# Patient Record
Sex: Female | Born: 1945 | Race: White | Hispanic: No | Marital: Married | Smoking: Never smoker
Health system: Southern US, Community
[De-identification: ages and names within clinical notes are randomized; demographics above are authoritative.]

## PROBLEM LIST (undated history)

## (undated) DIAGNOSIS — F419 Anxiety disorder, unspecified: Secondary | ICD-10-CM

## (undated) DIAGNOSIS — Z780 Asymptomatic menopausal state: Secondary | ICD-10-CM

## (undated) DIAGNOSIS — R002 Palpitations: Secondary | ICD-10-CM

## (undated) DIAGNOSIS — J438 Other emphysema: Secondary | ICD-10-CM

## (undated) DIAGNOSIS — K589 Irritable bowel syndrome without diarrhea: Secondary | ICD-10-CM

## (undated) DIAGNOSIS — E559 Vitamin D deficiency, unspecified: Secondary | ICD-10-CM

## (undated) DIAGNOSIS — Z1211 Encounter for screening for malignant neoplasm of colon: Secondary | ICD-10-CM

## (undated) DIAGNOSIS — I38 Endocarditis, valve unspecified: Secondary | ICD-10-CM

## (undated) DIAGNOSIS — K635 Polyp of colon: Secondary | ICD-10-CM

## (undated) DIAGNOSIS — Z8719 Personal history of other diseases of the digestive system: Secondary | ICD-10-CM

## (undated) DIAGNOSIS — E7800 Pure hypercholesterolemia, unspecified: Secondary | ICD-10-CM

## (undated) DIAGNOSIS — I059 Rheumatic mitral valve disease, unspecified: Secondary | ICD-10-CM

## (undated) DIAGNOSIS — C679 Malignant neoplasm of bladder, unspecified: Secondary | ICD-10-CM

## (undated) DIAGNOSIS — I499 Cardiac arrhythmia, unspecified: Secondary | ICD-10-CM

## (undated) DIAGNOSIS — R5381 Other malaise: Secondary | ICD-10-CM

## (undated) DIAGNOSIS — K219 Gastro-esophageal reflux disease without esophagitis: Secondary | ICD-10-CM

## (undated) DIAGNOSIS — R5383 Other fatigue: Secondary | ICD-10-CM

## (undated) DIAGNOSIS — R0789 Other chest pain: Secondary | ICD-10-CM

## (undated) DIAGNOSIS — M810 Age-related osteoporosis without current pathological fracture: Secondary | ICD-10-CM

## (undated) DIAGNOSIS — C801 Malignant (primary) neoplasm, unspecified: Secondary | ICD-10-CM

## (undated) DIAGNOSIS — I340 Nonrheumatic mitral (valve) insufficiency: Secondary | ICD-10-CM

## (undated) DIAGNOSIS — K5792 Diverticulitis of intestine, part unspecified, without perforation or abscess without bleeding: Secondary | ICD-10-CM

## (undated) DIAGNOSIS — N3000 Acute cystitis without hematuria: Secondary | ICD-10-CM

## (undated) DIAGNOSIS — I471 Supraventricular tachycardia: Secondary | ICD-10-CM

## (undated) HISTORY — DX: Palpitations: R00.2

## (undated) HISTORY — DX: Age-related osteoporosis without current pathological fracture: M81.0

## (undated) HISTORY — DX: Other emphysema: J43.8

## (undated) HISTORY — DX: Other malaise: R53.81

## (undated) HISTORY — DX: Endocarditis, valve unspecified: I38

## (undated) HISTORY — DX: Asymptomatic menopausal state: Z78.0

## (undated) HISTORY — DX: Gastro-esophageal reflux disease without esophagitis: K21.9

## (undated) HISTORY — DX: Other chest pain: R07.89

## (undated) HISTORY — PX: TONSILLECTOMY: SUR1361

## (undated) HISTORY — PX: ENDOMETRIAL BIOPSY: SHX622

## (undated) HISTORY — DX: Rheumatic mitral valve disease, unspecified: I05.9

## (undated) HISTORY — DX: Polyp of colon: K63.5

## (undated) HISTORY — PX: APPENDECTOMY: SHX54

## (undated) HISTORY — DX: Other fatigue: R53.83

## (undated) HISTORY — DX: Vitamin D deficiency, unspecified: E55.9

## (undated) HISTORY — PX: OTHER SURGICAL HISTORY: SHX169

## (undated) HISTORY — DX: Encounter for screening for malignant neoplasm of colon: Z12.11

## (undated) HISTORY — DX: Anxiety disorder, unspecified: F41.9

## (undated) HISTORY — DX: Diverticulitis of intestine, part unspecified, without perforation or abscess without bleeding: K57.92

## (undated) HISTORY — DX: Pure hypercholesterolemia, unspecified: E78.00

## (undated) HISTORY — DX: Supraventricular tachycardia: I47.1

## (undated) HISTORY — DX: Malignant neoplasm of bladder, unspecified: C67.9

## (undated) HISTORY — PX: CHOLECYSTECTOMY: SHX55

## (undated) HISTORY — DX: Irritable bowel syndrome, unspecified: K58.9

## (undated) HISTORY — DX: Acute cystitis without hematuria: N30.00

---

## 2014-04-19 ENCOUNTER — Emergency Department (HOSPITAL_COMMUNITY)
Admission: EM | Admit: 2014-04-19 | Discharge: 2014-04-20 | Disposition: A | Discharge status: Home / Self Care | Payer: Medicare HMO | Attending: Emergency Medicine | Admitting: Emergency Medicine

## 2014-04-19 ENCOUNTER — Encounter (HOSPITAL_COMMUNITY): Payer: Self-pay | Admitting: *Deleted

## 2014-04-19 ENCOUNTER — Emergency Department (HOSPITAL_COMMUNITY): Payer: Medicare HMO

## 2014-04-19 DIAGNOSIS — Z79899 Other long term (current) drug therapy: Secondary | ICD-10-CM | POA: Insufficient documentation

## 2014-04-19 DIAGNOSIS — H4710 Unspecified papilledema: Secondary | ICD-10-CM | POA: Diagnosis not present

## 2014-04-19 DIAGNOSIS — G935 Compression of brain: Secondary | ICD-10-CM | POA: Diagnosis not present

## 2014-04-19 DIAGNOSIS — R519 Headache, unspecified: Secondary | ICD-10-CM

## 2014-04-19 DIAGNOSIS — Z87891 Personal history of nicotine dependence: Secondary | ICD-10-CM | POA: Insufficient documentation

## 2014-04-19 DIAGNOSIS — R110 Nausea: Secondary | ICD-10-CM | POA: Insufficient documentation

## 2014-04-19 DIAGNOSIS — R51 Headache: Secondary | ICD-10-CM | POA: Diagnosis present

## 2014-04-19 MED ORDER — ACETAMINOPHEN 325 MG PO TABS: 650.0000 mg | ORAL_TABLET | Freq: Once | ORAL | Status: DC

## 2014-04-19 MED ORDER — FENTANYL CITRATE 0.05 MG/ML IJ SOLN: 100.0000 ug | Freq: Once | INTRAMUSCULAR | Status: DC

## 2014-04-19 NOTE — ED Notes (Signed)
Dr. Mesner at bedside   

## 2014-04-19 NOTE — ED Provider Notes (Signed)
CSN: [id number]     Arrival date & time 04/19/14  1836 History   First MD Initiated Contact with Patient 04/19/14 1913     Chief Complaint  Patient presents with  . Headache  . Eye Problem     (Consider location/radiation/quality/duration/timing/severity/associated sxs/prior Treatment) HPI  68 year old female with a known Chiari I malformation presents to the emergency department from optometry office secondary to papilledema. Patient states that she's had 2 months or so of bilateral frontal headaches worse in the morning slowly improving throughout the day also associated with some nausea. No significant vision changes during this time, no fevers, diarrhea, abdominal pain or other symptoms. She has no neurologic changes such as numbness or weakness anywhere.  History reviewed. No pertinent past medical history. History reviewed. No pertinent past surgical history. History reviewed. No pertinent family history. History  Substance Use Topics  . Smoking status: Former Research scientist (life sciences)  . Smokeless tobacco: Not on file  . Alcohol Use: No   OB History    No data available     Review of Systems  Constitutional: Negative for fever and chills.  HENT: Negative for congestion and drooling.   Respiratory: Negative for cough and shortness of breath.   Cardiovascular: Negative for chest pain.  Gastrointestinal: Positive for nausea.  Musculoskeletal: Negative for arthralgias.  Neurological: Positive for headaches.  All other systems reviewed and are negative.     Allergies  Review of patient's allergies indicates no known allergies.  Home Medications   Prior to Admission medications   Medication Sig Start Date End Date Taking? Authorizing Provider  ALPRAZolam Duanne Moron) 0.5 MG tablet Take 0.5 mg by mouth at bedtime as needed for anxiety.   Yes Historical Provider, MD  omega-3 acid ethyl esters (LOVAZA) 1 G capsule Take 1 g by mouth daily.   Yes Historical Provider, MD  pantoprazole (PROTONIX)  40 MG tablet Take 40 mg by mouth daily.   Yes Historical Provider, MD   BP 128/67 mmHg  Pulse 64  Temp(Src) 98.3 F (36.8 C) (Oral)  Resp 15  SpO2 94% Physical Exam  Constitutional: She is oriented to person, place, and time. She appears well-developed and well-nourished.  HENT:  Head: Normocephalic and atraumatic.  Eyes: Conjunctivae and EOM are normal. Right eye exhibits no chemosis and no discharge. Left eye exhibits no chemosis and no discharge. Right conjunctiva is not injected. Right conjunctiva has no hemorrhage. Left conjunctiva is not injected. Left conjunctiva has no hemorrhage.  Fundoscopic exam:      The right eye shows hemorrhage and papilledema.       The left eye shows no hemorrhage and no papilledema.  Cardiovascular: Normal rate and regular rhythm.   Pulmonary/Chest: Effort normal and breath sounds normal. No respiratory distress.  Abdominal: Soft. She exhibits no distension. There is no tenderness. There is no rebound.  Musculoskeletal: Normal range of motion. She exhibits no edema or tenderness.  Neurological: She is alert and oriented to person, place, and time.  Skin: Skin is warm and dry.  Nursing note and vitals reviewed.   ED Course  Procedures (including critical care time) Labs Review Labs Reviewed - No data to display  Imaging Review Mr Brain Wo Contrast  04/19/2014   CLINICAL DATA:  Daily headaches for 2 months, RIGHT eye hemorrhage noted at doctor's appointment today, no vision change.  EXAM: MRI HEAD AND ORBITS WITHOUT CONTRAST  TECHNIQUE: Multiplanar, multiecho pulse sequences of the brain and surrounding structures were obtained without intravenous contrast. Multiplanar, multiecho  pulse sequences of the orbits and surrounding structures were obtained including fat saturation techniques, without intravenous contrast administration.  COMPARISON:  None available for comparison at time of study interpretation.  FINDINGS: MRI HEAD FINDINGS  No reduced  diffusion to suggest acute ischemia. No susceptibility artifact to suggest hemorrhage. The ventricles and sulci are normal for patient's age. A few subcentimeter supratentorial white matter FLAIR T2 hyperintensities are less than expected for age, though nonspecific can be seen with chronic small vessel ischemic disease. No midline shift or mass effect.  No abnormal extra-axial fluid collections. Normal major intracranial vascular flow voids seen at the skull base. Cerebellar tonsils descend 12 mm below the foramen magnum with pointed appearance and effaced cerebral spinal fluid space at the craniocervical junction.  Paranasal sinuses and mastoid air cells appear well-aerated. No abnormal sellar expansion. Pannus about the odontoid process can be seen with CPPD. No suspicious calvarial bone marrow signal.  MRI ORBITS FINDINGS  Bilateral ocular globes are well formed, ocular lenses are located. Normal signal characteristics of the ocular globes without fluid fluid level. Normal appearance of the optic nerve sheath complexes. Preservation of the orbital fat. Normal appearance of the extraocular muscles. Superior ophthalmic veins are not enlarged. Minimal susceptibility artifact along the anterior chamber the eye most consistent with makeup artifact.  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  Mild white matter changes suggest chronic small vessel ischemic disease.  MRI ORBITS:  Normal noncontrast MRI of the orbits.   Electronically Signed   By: Elon Alas   On: 04/19/2014 22:56   Mr Darnelle Catalan Wo Cm  04/19/2014   CLINICAL DATA:  Daily headaches for 2 months, RIGHT eye hemorrhage noted at doctor's appointment today, no vision change.  EXAM: MRI HEAD AND ORBITS WITHOUT CONTRAST  TECHNIQUE: Multiplanar, multiecho pulse sequences of the brain and  surrounding structures were obtained without intravenous contrast. Multiplanar, multiecho pulse sequences of the orbits and surrounding structures were obtained including fat saturation techniques, without intravenous contrast administration.  COMPARISON:  None available for comparison at time of study interpretation.  FINDINGS: MRI HEAD FINDINGS  No reduced diffusion to suggest acute ischemia. No susceptibility artifact to suggest hemorrhage. The ventricles and sulci are normal for patient's age. A few subcentimeter supratentorial white matter FLAIR T2 hyperintensities are less than expected for age, though nonspecific can be seen with chronic small vessel ischemic disease. No midline shift or mass effect.  No abnormal extra-axial fluid collections. Normal major intracranial vascular flow voids seen at the skull base. Cerebellar tonsils descend 12 mm below the foramen magnum with pointed appearance and effaced cerebral spinal fluid space at the craniocervical junction.  Paranasal sinuses and mastoid air cells appear well-aerated. No abnormal sellar expansion. Pannus about the odontoid process can be seen with CPPD. No suspicious calvarial bone marrow signal.  MRI ORBITS FINDINGS  Bilateral ocular globes are well formed, ocular lenses are located. Normal signal characteristics of the ocular globes without fluid fluid level. Normal appearance of the optic nerve sheath complexes. Preservation of the orbital fat. Normal appearance of the extraocular muscles. Superior ophthalmic veins are not enlarged. Minimal susceptibility artifact along the anterior chamber the eye most consistent with makeup artifact.  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  Mild white matter changes suggest  chronic small vessel ischemic disease.  MRI ORBITS:  Normal noncontrast MRI of  the orbits.   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 No other significant findings on history of physical. We'll get MRI of head and orbits to evaluate for emergent causes of the papilledema. If this is negative patient will likely be discharged to follow-up with ophthalmology.  MRI negative. Spoke with ophtho who agreed she could follow up tomorrow. Patient wanting to follow up with optho closer to her home, info for our opthalmologist given in case that is her wish.   Merrily Pew, MD 04/19/14 [id number]  Pamella Pert, MD 04/20/14 1226

## 2014-04-19 NOTE — ED Notes (Signed)
Pt reports having headaches every morning for past two months. Went for an eye exam today and was sent here due to hemorrhages noted to right eye on exam and swelling. Denies any changes in vision.

## 2014-04-20 NOTE — ED Notes (Signed)
Pt. Left with all belongings and refused wheelchair 

## 2015-06-29 DIAGNOSIS — K219 Gastro-esophageal reflux disease without esophagitis: Secondary | ICD-10-CM

## 2015-06-29 DIAGNOSIS — I059 Rheumatic mitral valve disease, unspecified: Secondary | ICD-10-CM | POA: Insufficient documentation

## 2015-06-29 DIAGNOSIS — M810 Age-related osteoporosis without current pathological fracture: Secondary | ICD-10-CM | POA: Insufficient documentation

## 2015-06-29 DIAGNOSIS — F419 Anxiety disorder, unspecified: Secondary | ICD-10-CM

## 2015-06-29 DIAGNOSIS — Z780 Asymptomatic menopausal state: Secondary | ICD-10-CM | POA: Insufficient documentation

## 2015-06-29 DIAGNOSIS — E559 Vitamin D deficiency, unspecified: Secondary | ICD-10-CM | POA: Insufficient documentation

## 2015-06-29 DIAGNOSIS — E7800 Pure hypercholesterolemia, unspecified: Secondary | ICD-10-CM

## 2015-06-29 HISTORY — DX: Vitamin D deficiency, unspecified: E55.9

## 2015-06-29 HISTORY — DX: Gastro-esophageal reflux disease without esophagitis: K21.9

## 2015-06-29 HISTORY — DX: Asymptomatic menopausal state: Z78.0

## 2015-06-29 HISTORY — DX: Rheumatic mitral valve disease, unspecified: I05.9

## 2015-06-29 HISTORY — DX: Age-related osteoporosis without current pathological fracture: M81.0

## 2015-06-29 HISTORY — DX: Pure hypercholesterolemia, unspecified: E78.00

## 2015-06-29 HISTORY — DX: Anxiety disorder, unspecified: F41.9

## 2015-07-02 DIAGNOSIS — R5383 Other fatigue: Secondary | ICD-10-CM

## 2015-07-02 DIAGNOSIS — R5381 Other malaise: Secondary | ICD-10-CM

## 2015-07-02 HISTORY — DX: Other malaise: R53.81

## 2015-07-02 HISTORY — DX: Other fatigue: R53.83

## 2016-01-03 DIAGNOSIS — Z1211 Encounter for screening for malignant neoplasm of colon: Secondary | ICD-10-CM

## 2016-01-03 HISTORY — DX: Encounter for screening for malignant neoplasm of colon: Z12.11

## 2016-07-03 DIAGNOSIS — J438 Other emphysema: Secondary | ICD-10-CM | POA: Insufficient documentation

## 2016-07-03 DIAGNOSIS — N3000 Acute cystitis without hematuria: Secondary | ICD-10-CM | POA: Insufficient documentation

## 2016-07-03 HISTORY — DX: Other emphysema: J43.8

## 2016-07-03 HISTORY — DX: Acute cystitis without hematuria: N30.00

## 2016-10-29 DIAGNOSIS — I471 Supraventricular tachycardia: Secondary | ICD-10-CM

## 2016-10-29 DIAGNOSIS — I4719 Other supraventricular tachycardia: Secondary | ICD-10-CM

## 2016-10-29 DIAGNOSIS — R0789 Other chest pain: Secondary | ICD-10-CM | POA: Insufficient documentation

## 2016-10-29 DIAGNOSIS — R002 Palpitations: Secondary | ICD-10-CM

## 2016-10-29 HISTORY — DX: Supraventricular tachycardia: I47.1

## 2016-10-29 HISTORY — DX: Palpitations: R00.2

## 2016-10-29 HISTORY — DX: Other supraventricular tachycardia: I47.19

## 2016-10-29 HISTORY — DX: Other chest pain: R07.89

## 2017-02-23 ENCOUNTER — Encounter: Payer: Self-pay | Admitting: *Deleted

## 2017-03-16 DIAGNOSIS — R079 Chest pain, unspecified: Secondary | ICD-10-CM | POA: Insufficient documentation

## 2017-03-16 NOTE — Progress Notes (Signed)
Cardiology Office Note:    Date:  03/18/2017   ID:  Kara Reynolds, DOB [birth year], MRN [id number]  PCP:  Raina Mina., MD  Cardiologist:  Shirlee More, MD     ASSESSMENT:    1. Premature ventricular contractions (PVCs) (VPCs)   2. Paroxysmal atrial tachycardia (HCC)    PLAN:    In order of problems listed above:  1. Noted on her stress echo, she is minimally symptomatic I offered her reassurance she will avoid over-the-counter proarrhythmic and alarmed her with a prescription for short acting low-dose beta-blocker that she can employ as needed.  I reassured her that her echocardiogram is normal she has no significant valvular regurgitation and her stress test showed no evidence of ischemia.  I do not think she requires an event monitor or loop recorder. 2. Stable   Next appointment: 1 year   Medication Adjustments/Labs and Tests Ordered: Current medicines are reviewed at length with the patient today.  Concerns regarding medicines are outlined above.  No orders of the defined types were placed in this encounter.  Meds ordered this encounter  Medications  . metoprolol tartrate (LOPRESSOR) 25 MG tablet    Sig: Take 2 tablets (50 mg total) daily as needed by mouth.    Dispense:  30 tablet    Refill:  1    Chief Complaint  Patient presents with  . Follow-up    What did my echo and stress test at [facility] show?  . Palpitations    History of Present Illness:    Kara Reynolds is a 71 y.o. female with a hx of PAT and palpitation  last seen by me more than two years ago but recently seen at [facility] in June 2018.Kara KitchenShe had a TTE and stress echo performed 11/06/16 and wants to understand the results. Compliance with diet, lifestyle and medications: Yes She was seen in my previous practice in June underwent an echocardiogram and stress echo and is unsettled thinking she has valvular heart disease.  She notices her pulse is irregular at times but is not having palpitation no sustained  rapid heart rhythm no chest pain shortness of breath syncope or TIA.  She uses no over-the-counter proarrhythmic medications. Past Medical History:  Diagnosis Date  . Acute cystitis without hematuria 07/03/2016  . Anxiety 06/29/2015   Last Assessment & Plan:  Needs refill on this does not use it daily at all , but it does help her and she would like to continue with this and never fills this consistently   . Chest discomfort 10/29/2016  . Encounter for screening colonoscopy 01/03/2016  . GERD (gastroesophageal reflux disease) 06/29/2015   Last Assessment & Plan:  We discussed the side effects of the PPIs and use of zantac alternating if she would like, and she feels the PPI is needed for her GERD and does helps her, discussed bone loss as well as memory issues with the class as well  . Hypercholesteremia 06/29/2015   Last Assessment & Plan:  Update her fasting labs for her today and discussed diet which she and her husband are working on more with her weight as well  . Malaise and fatigue 07/02/2015   Last Assessment & Plan:  Off and on for her, she tries to remain active is retired now worked at [employer] and has had blood exposure and she is going to have the hepatitis C panel drawn for this purpose  . 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 Osteoporosis 06/29/2015   Overview:  Recommend meds. She isnt willing.  Ordering a new dexa.  Last Assessment & Plan:  Update her vitamin d level, she is not on meds for this and had her last exam this year in [place]  . Other emphysema ([place]) 07/03/2016  . Palpitations 10/29/2016  . Paroxysmal atrial tachycardia ([place]) 10/29/2016  . Postmenopausal 06/29/2015   Last Assessment & Plan:  Not using any hormones, she feels she is doing well and has her mammograms done at [place]  . Vitamin D deficiency 06/29/2015   Last Assessment & Plan:  Update her level for her today and see where she is at    Past  Surgical History:  Procedure Laterality Date  . CHOLECYSTECTOMY      Current Medications: Current Meds  Medication Sig  . ALPRAZolam (XANAX) 0.5 MG tablet Take 0.5 mg by mouth at bedtime as needed for anxiety.  Kara Reynolds aspirin EC 81 MG tablet Take 81 mg by mouth.  . pantoprazole (PROTONIX) 40 MG tablet Take 40 mg by mouth daily.     Allergies:   Amoxicillin-pot clavulanate   Social History   Socioeconomic History  . Marital status: Married    Spouse name: None  . Number of children: None  . Years of education: None  . Highest education level: None  Social Needs  . Financial resource strain: None  . Food insecurity - worry: None  . Food insecurity - inability: None  . Transportation needs - medical: None  . Transportation needs - non-medical: None  Occupational History  . None  Tobacco Use  . Smoking status: Never Smoker  . Smokeless tobacco: Never Used  Substance and Sexual Activity  . Alcohol use: No  . Drug use: No  . Sexual activity: None  Other Topics Concern  . None  Social History Narrative  . None     Family History: The patient's family history includes Prostate cancer in her father; Stroke in her maternal grandfather. ROS:   Please see the history of present illness.    All other systems reviewed and are negative.  EKGs/Labs/Other Studies Reviewed:   EKG was done 12/04/2016 The following studies were reviewed today: Recent echo and stress echo were normal, she had physiologic Recent Labs: No results found for requested labs within last 8760 hours.  Recent Lipid Panel No results found for: CHOL, TRIG, HDL, CHOLHDL, VLDL, LDLCALC, LDLDIRECT  Physical Exam:    VS:  BP 120/90 (BP Location: Right Arm, Patient Position: Sitting, Cuff Size: Normal)   Pulse 67   Ht 5' 5.5" (1.664 m)   Wt 156 lb (70.8 kg)   SpO2 98%   BMI 25.56 kg/m     Wt Readings from Last 3 Encounters:  03/18/17 156 lb (70.8 kg)     GEN:  Well nourished, well developed in no acute  distress HEENT: Normal NECK: No JVD; No carotid bruits LYMPHATICS: No lymphadenopathy CARDIAC: RRR, no murmurs, rubs, gallops RESPIRATORY:  Clear to auscultation without rales, wheezing or rhonchi  ABDOMEN: Soft, non-tender, non-distended MUSCULOSKELETAL:  No edema; No deformity  SKIN: Warm and dry NEUROLOGIC:  Alert and oriented x 3 PSYCHIATRIC:  Normal affect    Signed, Shirlee More, MD  03/18/2017 11:53 AM    [place]

## 2017-03-18 ENCOUNTER — Encounter: Payer: Self-pay | Admitting: Cardiology

## 2017-03-18 ENCOUNTER — Ambulatory Visit: Payer: Medicare HMO | Admitting: Cardiology

## 2017-03-18 VITALS — BP 120/90 | HR 67 | Ht 65.5 in | Wt 156.0 lb

## 2017-03-18 DIAGNOSIS — I471 Supraventricular tachycardia: Secondary | ICD-10-CM

## 2017-03-18 DIAGNOSIS — I493 Ventricular premature depolarization: Secondary | ICD-10-CM | POA: Diagnosis not present

## 2017-03-18 MED ORDER — METOPROLOL TARTRATE 25 MG PO TABS: 50.0000 mg | ORAL_TABLET | Freq: Every day | ORAL | 1 refills | Status: AC | PRN

## 2017-03-18 NOTE — Patient Instructions (Addendum)
Medication Instructions:  Your physician has recommended you make the following change in your medication:  START metoprolol (Lopressor) 25 mg daily as needed for palpitations.  Labwork: None  Testing/Procedures: None  Follow-Up: Your physician wants you to follow-up in: 1 year. You will receive a reminder letter in the mail two months in advance. If you don't receive a letter, please call our office to schedule the follow-up appointment.  Any Other Special Instructions Will Be Listed Below (If Applicable).     If you need a refill on your cardiac medications before your next appointment, please call your pharmacy.    1. Avoid all over-the-counter antihistamines except Claritin/Loratadine and Zyrtec/Cetrizine. 2. Avoid all combination including cold sinus allergies flu decongestant and sleep medications 3. You can use Robitussin DM Mucinex and Mucinex DM for cough. 4. can use Tylenol aspirin ibuprofen and naproxen but no combinations such as sleep or sinus.

## 2018-01-13 ENCOUNTER — Ambulatory Visit (INDEPENDENT_AMBULATORY_CARE_PROVIDER_SITE_OTHER): Payer: Medicare HMO | Admitting: Gastroenterology

## 2018-01-13 ENCOUNTER — Encounter: Payer: Self-pay | Admitting: Gastroenterology

## 2018-01-13 ENCOUNTER — Other Ambulatory Visit (INDEPENDENT_AMBULATORY_CARE_PROVIDER_SITE_OTHER): Payer: Medicare HMO

## 2018-01-13 VITALS — BP 128/70 | HR 70 | Ht 65.0 in | Wt 154.1 lb

## 2018-01-13 DIAGNOSIS — R1013 Epigastric pain: Secondary | ICD-10-CM | POA: Diagnosis not present

## 2018-01-13 DIAGNOSIS — R112 Nausea with vomiting, unspecified: Secondary | ICD-10-CM

## 2018-01-13 MED ORDER — AMBULATORY NON FORMULARY MEDICATION: 1 refills | Status: DC

## 2018-01-13 NOTE — Patient Instructions (Signed)
If you are age 71 or older, your body mass index should be between 23-30. Your Body mass index is 25.65 kg/m. If this is out of the aforementioned range listed, please consider follow up with your Primary Care Provider.  If you are age 14 or younger, your body mass index should be between 19-25. Your Body mass index is 25.65 kg/m. If this is out of the aformentioned range listed, please consider follow up with your Primary Care Provider.   You have been scheduled for an Upper GI Series and Small Bowel Follow Thru at [facility]. Your appointment is on 01/21/18 at 10am. Please arrive 15 minutes prior to your test for registration. Make certain not to have anything to eat or drink after midnight on the night before your test. If you need to reschedule, please contact radiology at [phone]. --------------------------------------------------------------------------------------------------------------- An upper GI series uses x rays to help diagnose problems of the upper GI tract, which includes the esophagus, stomach, and duodenum. The duodenum is the first part of the small intestine. An upper GI series is conducted by a radiology technologist or a radiologist-a doctor who specializes in x-ray imaging-at a hospital or outpatient center. While sitting or standing in front of an x-ray machine, the patient drinks barium liquid, which is often white and has a chalky consistency and taste. The barium liquid coats the lining of the upper GI tract and makes signs of disease show up more clearly on x rays. X-ray video, called fluoroscopy, is used to view the barium liquid moving through the esophagus, stomach, and duodenum. Additional x rays and fluoroscopy are performed while the patient lies on an x-ray table. To fully coat the upper GI tract with barium liquid, the technologist or radiologist may press on the abdomen or ask the patient to change position. Patients hold still in various positions, allowing the  technologist or radiologist to take x rays of the upper GI tract at different angles. If a technologist conducts the upper GI series, a radiologist will later examine the images to look for problems.  This test typically takes about 1 hour to complete --------------------------------------------------------------------------------------------------------------------------------------------- The Small Bowel Follow Thru examination is used to visualize the entire small bowel (intestines); specifically the connection between the small and large intestine. You will be positioned on a flat x-ray table and an image of your abdomen taken. Then the technologist will show the x-ray to the radiologist. The radiologist will instruct your technologist how much (1-2 cups) barium sulfate you will drink and when to begin taking the timed x-rays, usually 15-30 minutes after you begin drinking. Barium is a harmless substance that will highlight your small intestine by absorbing x-ray. The taste is chalky and it feels very heavy both in the cup and in your stomach.  After the first x-ray is taken and shown to the radiologist, he/she will determine when the next image is to be taken. This is repeated until the barium has reached the end of the small intestine and enters the beginning of the colon (cecum). At such time when the barium spills into the colon, you will be positioned on the x-ray table once again. The radiologist will use a fluoroscopic camera to take some detailed pictures of the connection between your small intestine and colon. The fluoroscope is an x-ray unit that works with a television/computer screen. The radiologist will apply pressure to your abdomen with his/her hand and a lead glove, a plastic paddle, or a paddle with an inflated rubber balloon  on the end. This is to spread apart your loops of intestine so he/she can see all areas.   This test typically takes around 1 hour to complete.  Important  Drink  plenty of water (8-10 cups/day) for a few days following the procedure to avoid constipation and blockage. The barium will make your stools white for a few days. --------------------------------------------------------------------------------------------------------------------------------------------  You have been scheduled for an abdominal ultrasound at [facility] (1st floor of hospital) on 01/21/18 at 10am. Please arrive 15 minutes prior to your appointment for registration. Make certain not to have anything to eat or drink 6 hours prior to your appointment. Should you need to reschedule your appointment, please contact radiology at [phone]. This test typically takes about 30 minutes to perform.  Please go to the lab on the 2nd floor [address] before you leave the office today.   We have sent the following medications to your pharmacy for you to pick up at your convenience: GI cocktail   You have been scheduled for an endoscopy. Please follow written instructions given to you at your visit today. If you use inhalers (even only as needed), please bring them with you on the day of your procedure. Your physician has requested that you go to www.startemmi.com and enter the access code given to you at your visit today. This web site gives a general overview about your procedure. However, you should still follow specific instructions given to you by our office regarding your preparation for the procedure.  Thank you,  Dr. Jackquline Denmark

## 2018-01-13 NOTE — Progress Notes (Signed)
Chief Complaint: Epigastric pain  Referring Provider:  Raina Mina., MD      ASSESSMENT AND PLAN;   #1.  Epigastric pain with episodic N/V, could have associated esophageal spasms. Pt has complicated biliary history.  Status post lap chole 11/2011 with cystic stump leak status post ERCP with biliary sphincterotomy and stent insertion 12/06/2011.  #2. GERD with small HH  Plan: - Continue protonix 40mg  po qd - Check CBC, CMP and lipase. - UGI series followed by EGD - Proceed with ultrasound of the abdomen complete. - Proceed with CT scan of the abdomen and pelvis if the above work-up is negative and continued problems. - GI cocktail 5 to 10 mL every 8 hours as needed #120 ml (equal amounts of Maalox, Donnatal and viscous lidocaine)    HPI:    Kara Reynolds is a 72 y.o. female Seth Bake' Sharon's mom (NP with Dr Georga Bora) Epigastric pain - like spasms "sweeps thru me" Xanax does help With associated nausea/vomiting Has been to the emergency room-noncardiac pains. Told to follow-up in the GI clinic. Describes pains as episodic, nonradiating, could not identify any triggering factors, almost feels like anxiety attacks, without jaundice dark urine or pale stools. No melena or hematochezia. No weight loss No fever or chills  Past GI procedures: -EGD 03/15/2014: Small hiatal hernia, mild gastritis, negative small bowel biopsies for celiac, negative biopsies for HP. -History of IBS with alternating diarrhea and constipation, last colonoscopy 07/09/2016 showing pancolonic diverticulosis predominantly in the sigmoid colon.    Past Medical History:  Diagnosis Date  . Acute cystitis without hematuria 07/03/2016  . Anxiety 06/29/2015   Last Assessment & Plan:  Needs refill on this does not use it daily at all , but it does help her and she would like to continue with this and never fills this consistently   . Chest discomfort 10/29/2016  . Encounter for screening colonoscopy 01/03/2016    . GERD (gastroesophageal reflux disease) 06/29/2015   Last Assessment & Plan:  We discussed the side effects of the PPIs and use of zantac alternating if she would like, and she feels the PPI is needed for her GERD and does helps her, discussed bone loss as well as memory issues with the class as well  . Hypercholesteremia 06/29/2015   Last Assessment & Plan:  Update her fasting labs for her today and discussed diet which she and her husband are working on more with her weight as well  . Malaise and fatigue 07/02/2015   Last Assessment & Plan:  Off and on for her, she tries to remain active is retired now worked at [employer] and has had blood exposure and she is going to have the hepatitis C panel drawn for this purpose  . 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 Osteoporosis 06/29/2015   Overview:  Recommend meds. She isnt willing.  Ordering a new dexa.  Last Assessment & Plan:  Update her vitamin d level, she is not on meds for this and had her last exam this year in [place]  . Other emphysema ([place]) 07/03/2016  . Palpitations 10/29/2016  . Paroxysmal atrial tachycardia ([place]) 10/29/2016  . Postmenopausal 06/29/2015   Last Assessment & Plan:  Not using any hormones, she feels she is doing well and has her mammograms done at [place]  . Vitamin D deficiency 06/29/2015   Last Assessment & Plan:  Update  her level for her today and see where she is at    Past Surgical History:  Procedure Laterality Date  . CHOLECYSTECTOMY      Family History  Problem Relation Age of Onset  . Prostate cancer Father        died about 15 years ago  . Stroke Maternal Grandfather   . Diabetes Child   . Colon cancer Neg Hx   . Esophageal cancer Neg Hx     Social History   Tobacco Use  . Smoking status: Never Smoker  . Smokeless tobacco: Never Used  Substance Use Topics  . Alcohol use: No  . Drug use: No    Current Outpatient Medications   Medication Sig Dispense Refill  . dicyclomine (BENTYL) 10 MG capsule Take 10 mg by mouth 2 (two) times daily as needed for spasms.     . metoprolol tartrate (LOPRESSOR) 25 MG tablet Take 2 tablets (50 mg total) daily as needed by mouth. 30 tablet 1  . pantoprazole (PROTONIX) 40 MG tablet Take 40 mg by mouth daily.    [name] ALPRAZolam (XANAX) 0.5 MG tablet Take 0.5 mg by mouth at bedtime as needed for anxiety.     No current facility-administered medications for this visit.     Allergies  Allergen Reactions  . Amoxicillin-Pot Clavulanate Nausea Only    Review of Systems:  Constitutional: Denies fever, chills, diaphoresis, appetite change and fatigue.  HEENT: Denies photophobia, eye pain, redness, hearing loss, ear pain, congestion, sore throat, rhinorrhea, sneezing, mouth sores, neck pain, neck stiffness and tinnitus.   Respiratory: Denies SOB, DOE, cough, chest tightness,  and wheezing.   Cardiovascular: Denies chest pain, palpitations and leg swelling.  Genitourinary: Denies dysuria, urgency, frequency, hematuria, flank pain and difficulty urinating.  Musculoskeletal: Denies myalgias, back pain, joint swelling, arthralgias and gait problem.  Skin: No rash.  Neurological: Denies dizziness, seizures, syncope, weakness, light-headedness, numbness and headaches.  Hematological: Denies adenopathy. Easy bruising, personal or family bleeding history  Psychiatric/Behavioral: No anxiety or depression     Physical Exam:    BP 128/70   Pulse 70   Ht 5\' 5"  (1.651 m)   Wt 154 lb 2 oz (69.9 kg)   BMI 25.65 kg/m  Filed Weights   01/13/18 1330  Weight: 154 lb 2 oz (69.9 kg)   Constitutional:  Well-developed, in no acute distress. Psychiatric: Normal mood and affect. Behavior is normal. HEENT: Pupils normal.  Conjunctivae are normal. No scleral icterus. Neck supple.  Cardiovascular: Normal rate, regular rhythm. No edema Pulmonary/chest: Effort normal and breath sounds normal. No wheezing,  rales or rhonchi. Abdominal: Soft, nondistended. Nontender. Bowel sounds active throughout. There are no masses palpable. No hepatomegaly. Rectal:  defered Neurological: Alert and oriented to person place and time. Skin: Skin is warm and dry. No rashes noted. 25 minutes spent with the patient today. Greater than 50% was spent in counseling and coordination of care with the patient    Carmell Austria, MD 01/13/2018, 2:00 PM  Cc: Raina Mina., MD

## 2018-01-14 LAB — COMPREHENSIVE METABOLIC PANEL
ALT: 26 U/L (ref 0–35)
AST: 19 U/L (ref 0–37)
Albumin: 4.6 g/dL (ref 3.5–5.2)
Alkaline Phosphatase: 51 U/L (ref 39–117)
BUN: 17 mg/dL (ref 6–23)
CO2: 31 mEq/L (ref 19–32)
Calcium: 9.7 mg/dL (ref 8.4–10.5)
Chloride: 100 mEq/L (ref 96–112)
Creatinine, Ser: 0.72 mg/dL (ref 0.40–1.20)
GFR: 84.72 mL/min (ref >60.00)
GLUCOSE: 93 mg/dL (ref 70–99)
POTASSIUM: 4.4 meq/L (ref 3.5–5.1)
Sodium: 138 mEq/L (ref 135–145)
Total Bilirubin: 0.7 mg/dL (ref 0.2–1.2)
Total Protein: 7.1 g/dL (ref 6.0–8.3)

## 2018-01-14 LAB — CBC WITH DIFFERENTIAL/PLATELET
Basophils Absolute: 0.1 10*3/uL (ref 0.0–0.1)
Basophils Relative: 0.9 % (ref 0.0–3.0)
EOS PCT: 1.2 % (ref 0.0–5.0)
Eosinophils Absolute: 0.1 10*3/uL (ref 0.0–0.7)
HEMATOCRIT: 41.6 % (ref 36.0–46.0)
Hemoglobin: 14.6 g/dL (ref 12.0–15.0)
LYMPHS ABS: 1.9 10*3/uL (ref 0.7–4.0)
LYMPHS PCT: 25.6 % (ref 12.0–46.0)
MCHC: 35.1 g/dL (ref 30.0–36.0)
MCV: 93.8 fl (ref 78.0–100.0)
MONOS PCT: 7.9 % (ref 3.0–12.0)
Monocytes Absolute: 0.6 10*3/uL (ref 0.1–1.0)
Neutro Abs: 4.8 10*3/uL (ref 1.4–7.7)
Neutrophils Relative %: 64.4 % (ref 43.0–77.0)
PLATELETS: 277 10*3/uL (ref 150.0–400.0)
RBC: 4.44 Mil/uL (ref 3.87–5.11)
RDW: 12.7 % (ref 11.5–15.5)
WBC: 7.5 10*3/uL (ref 4.0–10.5)

## 2018-01-14 LAB — LIPASE: LIPASE: 19 U/L (ref 11.0–59.0)

## 2018-01-21 ENCOUNTER — Ambulatory Visit (HOSPITAL_COMMUNITY)
Admission: RE | Admit: 2018-01-21 | Discharge: 2018-01-21 | Disposition: A | Discharge status: Home / Self Care | Payer: Medicare HMO | Source: Ambulatory Visit | Attending: Gastroenterology | Admitting: Gastroenterology

## 2018-01-21 DIAGNOSIS — R932 Abnormal findings on diagnostic imaging of liver and biliary tract: Secondary | ICD-10-CM | POA: Diagnosis not present

## 2018-01-21 DIAGNOSIS — R112 Nausea with vomiting, unspecified: Secondary | ICD-10-CM | POA: Diagnosis not present

## 2018-01-21 DIAGNOSIS — Z9049 Acquired absence of other specified parts of digestive tract: Secondary | ICD-10-CM | POA: Diagnosis not present

## 2018-01-21 DIAGNOSIS — R1013 Epigastric pain: Secondary | ICD-10-CM

## 2018-02-02 ENCOUNTER — Other Ambulatory Visit: Payer: Self-pay | Admitting: Gastroenterology

## 2018-02-02 ENCOUNTER — Ambulatory Visit (HOSPITAL_COMMUNITY)
Admission: RE | Admit: 2018-02-02 | Discharge: 2018-02-02 | Disposition: A | Discharge status: Home / Self Care | Payer: Medicare HMO | Source: Ambulatory Visit | Attending: Gastroenterology | Admitting: Gastroenterology

## 2018-02-02 DIAGNOSIS — R112 Nausea with vomiting, unspecified: Secondary | ICD-10-CM | POA: Insufficient documentation

## 2018-02-02 DIAGNOSIS — K219 Gastro-esophageal reflux disease without esophagitis: Secondary | ICD-10-CM | POA: Diagnosis not present

## 2018-02-02 DIAGNOSIS — K449 Diaphragmatic hernia without obstruction or gangrene: Secondary | ICD-10-CM | POA: Diagnosis not present

## 2018-02-02 DIAGNOSIS — R1013 Epigastric pain: Secondary | ICD-10-CM | POA: Diagnosis not present

## 2018-02-02 DIAGNOSIS — K225 Diverticulum of esophagus, acquired: Secondary | ICD-10-CM | POA: Insufficient documentation

## 2018-02-09 ENCOUNTER — Encounter: Payer: Self-pay | Admitting: Gastroenterology

## 2018-02-23 ENCOUNTER — Encounter: Payer: Self-pay | Admitting: Gastroenterology

## 2018-02-23 ENCOUNTER — Ambulatory Visit (AMBULATORY_SURGERY_CENTER): Payer: Medicare HMO | Admitting: Gastroenterology

## 2018-02-23 VITALS — BP 145/71 | HR 58 | Temp 98.0°F | Resp 12 | Ht 65.0 in | Wt 154.0 lb

## 2018-02-23 DIAGNOSIS — D131 Benign neoplasm of stomach: Secondary | ICD-10-CM

## 2018-02-23 DIAGNOSIS — K317 Polyp of stomach and duodenum: Secondary | ICD-10-CM

## 2018-02-23 DIAGNOSIS — K449 Diaphragmatic hernia without obstruction or gangrene: Secondary | ICD-10-CM

## 2018-02-23 DIAGNOSIS — R112 Nausea with vomiting, unspecified: Secondary | ICD-10-CM

## 2018-02-23 DIAGNOSIS — K2970 Gastritis, unspecified, without bleeding: Secondary | ICD-10-CM | POA: Diagnosis not present

## 2018-02-23 DIAGNOSIS — R1013 Epigastric pain: Secondary | ICD-10-CM

## 2018-02-23 MED ORDER — DICYCLOMINE HCL 10 MG PO CAPS: 10.0000 mg | ORAL_CAPSULE | Freq: Two times a day (BID) | ORAL | 6 refills | Status: DC

## 2018-02-23 MED ORDER — SODIUM CHLORIDE 0.9 % IV SOLN: 500.0000 mL | Freq: Once | INTRAVENOUS | Status: DC

## 2018-02-23 NOTE — Op Note (Signed)
[facility] Patient Name: Kara Reynolds Procedure Date: 02/23/2018 9:11 AM MRN: [id number] Endoscopist: Jackquline Denmark , MD Age: 72 Referring MD:  Date of Birth: 1945/10/08 Gender: Female Account #: [id number] Procedure:                Upper GI endoscopy Indications:              #1. Epigastric pain with episodic N/V, could have                            associated esophageal spasms. Pt has complicated                            biliary history. Status post lap chole 11/2011 with                            cystic stump leak status post ERCP with biliary                            sphincterotomy and stent insertion 12/06/2011.                           #2. GERD with small HH Medicines:                Monitored Anesthesia Care Procedure:                Pre-Anesthesia Assessment:                           - Prior to the procedure, a History and Physical                            was performed, and patient medications and                            allergies were reviewed. The patient's tolerance of                            previous anesthesia was also reviewed. The risks                            and benefits of the procedure and the sedation                            options and risks were discussed with the patient.                            All questions were answered, and informed consent                            was obtained. Prior Anticoagulants: The patient has                            taken no previous anticoagulant or antiplatelet  agents. ASA Grade Assessment: II - A patient with                            mild systemic disease. After reviewing the risks                            and benefits, the patient was deemed in                            satisfactory condition to undergo the procedure.                           After obtaining informed consent, the endoscope was                            passed under direct vision. Throughout  the                            procedure, the patient's blood pressure, pulse, and                            oxygen saturations were monitored continuously. The                            Endoscope was introduced through the mouth, and                            advanced to the second part of duodenum. The upper                            GI endoscopy was accomplished without difficulty.                            The patient tolerated the procedure well. 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 A                            small hiatal hernia was present. Biopsies were                            obtained from the proximal and distal esophagus                            with cold forceps for histology to r/o eosinophilic                            esophagitis.                           Localized mild inflammation characterized by                            congestion (edema)  was found in the gastric antrum.                            Biopsies were taken with a cold forceps for                            histology. Estimated blood loss: none.                           A few (6-7) 6 to 8 mm sessile polyps with no                            bleeding were found in the gastric fundus and in                            the gastric body. 3 polyps was removed with a hot                            snare. Resection and retrieval were complete.                            Estimated blood loss: none.                           The duodenum was normal.                           The exam was otherwise without abnormality. Complications:            No immediate complications. Estimated Blood Loss:     Estimated blood loss: none. Impression:               - Small hiatal hernia.                           - Mild Gastritis. Biopsied.                           - Incidental small gastric polyps (s/p polypectomy                             x3). Recommendation:           - Patient has a contact number available for                            emergencies. The signs and symptoms of potential                            delayed complications were discussed with the                            patient. Return to normal activities tomorrow.                            Written discharge instructions were provided to the  patient.                           - Resume previous diet.                           - Continue present medications including Protonix,                            Mylanta, Bentyl                           - Await pathology results.                           - Return to GI clinic in 12 weeks. Jackquline Denmark, MD 02/23/2018 9:36:15 AM This report has been signed electronically.

## 2018-02-23 NOTE — Progress Notes (Signed)
Report to PACU, RN, vss, BBS= Clear.  

## 2018-02-23 NOTE — Progress Notes (Signed)
Called to room to assist during endoscopic procedure.  Patient ID and intended procedure confirmed with present staff. Received instructions for my participation in the procedure from the performing physician.  

## 2018-02-23 NOTE — Patient Instructions (Signed)
Continue present medications including Protonix, Mylanta, and Bentyl Return to GI clinic in 12 weeks.  Dr Steve Rattler office should call you to set up.   YOU HAD AN ENDOSCOPIC PROCEDURE TODAY AT [facility]:   Refer to the procedure report that was given to you for any specific questions about what was found during the examination.  If the procedure report does not answer your questions, please call your gastroenterologist to clarify.  If you requested that your care partner not be given the details of your procedure findings, then the procedure report has been included in a sealed envelope for you to review at your convenience later.  YOU SHOULD EXPECT: Some feelings of bloating in the abdomen. Passage of more gas than usual.  Walking can help get rid of the air that was put into your GI tract during the procedure and reduce the bloating. If you had a lower endoscopy (such as a colonoscopy or flexible sigmoidoscopy) you may notice spotting of blood in your stool or on the toilet paper. If you underwent a bowel prep for your procedure, you may not have a normal bowel movement for a few days.  Please Note:  You might notice some irritation and congestion in your nose or some drainage.  This is from the oxygen used during your procedure.  There is no need for concern and it should clear up in a day or so.  SYMPTOMS TO REPORT IMMEDIATELY:    Following upper endoscopy (EGD)  Vomiting of blood or coffee ground material  New chest pain or pain under the shoulder blades  Painful or persistently difficult swallowing  New shortness of breath  Fever of 100F or higher  Black, tarry-looking stools  For urgent or emergent issues, a gastroenterologist can be reached at any hour by calling [phone].   DIET:  We do recommend a small meal at first, but then you may proceed to your regular diet.  Drink plenty of fluids but you should avoid alcoholic beverages for 24 hours.  ACTIVITY:   You should plan to take it easy for the rest of today and you should NOT DRIVE or use heavy machinery until tomorrow (because of the sedation medicines used during the test).    FOLLOW UP: Our staff will call the number listed on your records the next business day following your procedure to check on you and address any questions or concerns that you may have regarding the information given to you following your procedure. If we do not reach you, we will leave a message.  However, if you are feeling well and you are not experiencing any problems, there is no need to return our call.  We will assume that you have returned to your regular daily activities without incident.  If any biopsies were taken you will be contacted by phone or by letter within the next 1-3 weeks.  Please call us at [phone] if you have not heard about the biopsies in 3 weeks.    SIGNATURES/CONFIDENTIALITY: You and/or your care partner have signed paperwork which will be entered into your electronic medical record.  These signatures attest to the fact that that the information above on your After Visit Summary has been reviewed and is understood.  Full responsibility of the confidentiality of this discharge information lies with you and/or your care-partner.

## 2018-02-24 ENCOUNTER — Telehealth: Payer: Self-pay | Admitting: *Deleted

## 2018-02-24 NOTE — Telephone Encounter (Signed)
  Follow up Call-  Call back number 02/23/2018  Post procedure Call Back phone  # [phone]  Permission to leave phone message Yes  Some recent data might be hidden     Patient questions:  Do you have a fever, pain , or abdominal swelling? No. Pain Score  0 *  Have you tolerated food without any problems? Yes.    Have you been able to return to your normal activities? Yes.    Do you have any questions about your discharge instructions: Diet   No. Medications  No. Follow up visit  No.  Do you have questions or concerns about your Care? No.  Actions: * If pain score is 4 or above: No action needed, pain <4.

## 2018-03-01 ENCOUNTER — Encounter: Payer: Self-pay | Admitting: Gastroenterology

## 2018-03-02 ENCOUNTER — Encounter: Payer: Self-pay | Admitting: Gastroenterology

## 2018-06-02 ENCOUNTER — Encounter: Payer: Self-pay | Admitting: Gastroenterology

## 2018-06-02 ENCOUNTER — Ambulatory Visit: Payer: Medicare HMO | Admitting: Gastroenterology

## 2018-06-02 VITALS — BP 134/82 | HR 63 | Wt 158.5 lb

## 2018-06-02 DIAGNOSIS — R1013 Epigastric pain: Secondary | ICD-10-CM | POA: Diagnosis not present

## 2018-06-02 MED ORDER — PANTOPRAZOLE SODIUM 40 MG PO TBEC: 40.0000 mg | DELAYED_RELEASE_TABLET | Freq: Every day | ORAL | 11 refills | Status: DC

## 2018-06-02 NOTE — Progress Notes (Signed)
Chief Complaint: Epigastric pain  Referring Provider:  Raina Mina., MD      ASSESSMENT AND PLAN;   #1.  Epigastric pain (resolved). Pt has complicated biliary history.  Status post lap chole 11/2011 with cystic stump leak status post ERCP with biliary sphincterotomy and stent insertion 12/06/2011. [place] 01/2018 - showing very early cirrhosis per report (doubt significance), normal LFTs with alb 4.6 01/2018   #2. GERD with small HH. UGI with SB series 01/2018- small asymptomatic Zenker's, small hiatal hernia.  Otherwise normal. Neg EGD 02/2018 except for small hiatal hernia, fundic gland polyps.  Plan: - Continue protonix 40mg  po qd. #30, 11 refills. - If any problems, consider CT scan of the abdomen and pelvis if the above work-up is negative and continued problems. - RTC in 6 months.    HPI:    Kara Reynolds is a 73 y.o. female Kara Reynolds' Sharon's mom (NP with Dr Georga Bora) For follow-up visit Feels much better.  No further nausea/vomiting. Denies having any further epigastric pain Wants to hold off on any further evaluation by means of CT as she feels better Tolerating Protonix well. Eating much better-avoiding eating 3 hours before she goes to bed. No alcohol or sodas. We have discussed previous work-up in detail  Past GI procedures: -EGD10/2019-small hiatal hernia, fundic gland polyps.  Negative HP.  03/15/2014: Small hiatal hernia, mild gastritis, negative small bowel biopsies for celiac, negative biopsies for HP. -History of IBS with alternating diarrhea and constipation, last colonoscopy 07/09/2016 showing pancolonic diverticulosis predominantly in the sigmoid colon.    Past Medical History:  Diagnosis Date  . Acute cystitis without hematuria 07/03/2016  . Anxiety 06/29/2015   Last Assessment & Plan:  Needs refill on this does not use it daily at all , but it does help her and she would like to continue with this and never fills this consistently   . Chest discomfort  10/29/2016  . Colon polyp   . Encounter for screening colonoscopy 01/03/2016  . GERD (gastroesophageal reflux disease) 06/29/2015   Last Assessment & Plan:  We discussed the side effects of the PPIs and use of zantac alternating if she would like, and she feels the PPI is needed for her GERD and does helps her, discussed bone loss as well as memory issues with the class as well  . Hypercholesteremia 06/29/2015   Last Assessment & Plan:  Update her fasting labs for her today and discussed diet which she and her husband are working on more with her weight as well  . IBS (irritable bowel syndrome)   . Malaise and fatigue 07/02/2015   Last Assessment & Plan:  Off and on for her, she tries to remain active is retired now worked at [employer] and has had blood exposure and she is going to have the hepatitis C panel drawn for this purpose  . 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 Osteoporosis 06/29/2015   Overview:  Recommend meds. She isnt willing.  Ordering a new dexa.  Last Assessment & Plan:  Update her vitamin d level, she is not on meds for this and had her last exam this year in [place]  . Other emphysema ([place]) 07/03/2016  . Palpitations 10/29/2016  . Paroxysmal atrial tachycardia ([place]) 10/29/2016  . Postmenopausal 06/29/2015   Last Assessment & Plan:  Not using any hormones, she feels she is doing well and has her  mammograms done at [place]  . Vitamin D deficiency 06/29/2015   Last Assessment & Plan:  Update her level for her today and see where she is at    Past Surgical History:  Procedure Laterality Date  . APPENDECTOMY    . CHOLECYSTECTOMY    . TONSILLECTOMY     as a teenager    Family History  Problem Relation Age of Onset  . Prostate cancer Father        died about 15 years ago  . Stroke Maternal Grandfather   . Diabetes Child   . Colon cancer Neg Hx   . Esophageal cancer Neg Hx     Social History   Tobacco  Use  . Smoking status: Never Smoker  . Smokeless tobacco: Never Used  Substance Use Topics  . Alcohol use: No  . Drug use: Never    Current Outpatient Medications  Medication Sig Dispense Refill  . ALPRAZolam (XANAX) 0.5 MG tablet Take 0.5 mg by mouth at bedtime as needed for anxiety.    [name] aspirin 81 MG chewable tablet Chew by mouth.    . dicyclomine (BENTYL) 10 MG capsule Take 1 capsule (10 mg total) by mouth 2 (two) times daily. (Patient taking differently: Take 10 mg by mouth daily. In the morning) 60 capsule 6  . pantoprazole (PROTONIX) 40 MG tablet Take 40 mg by mouth daily.    . AMBULATORY NON FORMULARY MEDICATION Medication Name: GI Cocktail Use equal parts of Maalox, Donnatol, Lidocaine- Take 5-10 mls every 8 hours as needed . (Patient not taking: Reported on 02/23/2018) 120 mL 1  . metoprolol tartrate (LOPRESSOR) 25 MG tablet Take 2 tablets (50 mg total) daily as needed by mouth. (Patient not taking: Reported on 02/23/2018) 30 tablet 1   No current facility-administered medications for this visit.     Allergies  Allergen Reactions  . Amoxicillin-Pot Clavulanate Nausea Only    Review of Systems:  Constitutional: Denies fever, chills, diaphoresis, appetite change and fatigue.  HEENT: Denies photophobia, eye pain, redness, hearing loss, ear pain, congestion, sore throat, rhinorrhea, sneezing, mouth sores, neck pain, neck stiffness and tinnitus.   Respiratory: Denies SOB, DOE, cough, chest tightness,  and wheezing.   Cardiovascular: Denies chest pain, palpitations and leg swelling.  Genitourinary: Denies dysuria, urgency, frequency, hematuria, flank pain and difficulty urinating.  Musculoskeletal: Denies myalgias, back pain, joint swelling, arthralgias and gait problem.  Skin: No rash.  Neurological: Denies dizziness, seizures, syncope, weakness, light-headedness, numbness and headaches.  Hematological: Denies adenopathy. Easy bruising, personal or family bleeding history    Psychiatric/Behavioral: No anxiety or depression     Physical Exam:    BP 134/82   Pulse 63   Wt 158 lb 8 oz (71.9 kg)   BMI 26.38 kg/m  Filed Weights   06/02/18 1049  Weight: 158 lb 8 oz (71.9 kg)   Constitutional:  Well-developed, in no acute distress. Psychiatric: Normal mood and affect. Behavior is normal. HEENT: Pupils normal.  Conjunctivae are normal. No scleral icterus. Cardiovascular: Normal rate, regular rhythm. No edema Pulmonary/chest: Effort normal and breath sounds normal. No wheezing, rales or rhonchi. Abdominal: Soft, nondistended. Nontender. Bowel sounds active throughout. There are no masses palpable. No hepatomegaly.  15 minutes spent with the patient today. Greater than 50% was spent in counseling and coordination of care with the patient    Carmell Austria, MD 06/02/2018, 11:15 AM  Cc: Raina Mina., MD

## 2018-06-02 NOTE — Patient Instructions (Signed)
If you are age 72 or older, your body mass index should be between 23-30. Your Body mass index is 26.38 kg/m. If this is out of the aforementioned range listed, please consider follow up with your Primary Care Provider.  If you are age 18 or younger, your body mass index should be between 19-25. Your Body mass index is 26.38 kg/m. If this is out of the aformentioned range listed, please consider follow up with your Primary Care Provider.    We have sent the following medications to your pharmacy for you to pick up at your convenience: Protonix 40 mg once daily.   Thank you,  Dr. Jackquline Denmark

## 2019-01-13 DIAGNOSIS — Z01818 Encounter for other preprocedural examination: Secondary | ICD-10-CM

## 2019-01-13 HISTORY — PX: TRANSURETHRAL RESECTION OF BLADDER TUMOR: SHX2575

## 2019-02-09 ENCOUNTER — Other Ambulatory Visit: Payer: Self-pay

## 2019-02-09 ENCOUNTER — Other Ambulatory Visit (INDEPENDENT_AMBULATORY_CARE_PROVIDER_SITE_OTHER): Payer: Medicare HMO

## 2019-02-09 ENCOUNTER — Encounter: Payer: Self-pay | Admitting: Gastroenterology

## 2019-02-09 ENCOUNTER — Ambulatory Visit: Payer: Medicare HMO | Admitting: Gastroenterology

## 2019-02-09 VITALS — BP 136/80 | HR 62 | Temp 98.1°F | Ht 66.0 in | Wt 149.5 lb

## 2019-02-09 DIAGNOSIS — R1013 Epigastric pain: Secondary | ICD-10-CM

## 2019-02-09 DIAGNOSIS — R112 Nausea with vomiting, unspecified: Secondary | ICD-10-CM

## 2019-02-09 DIAGNOSIS — K219 Gastro-esophageal reflux disease without esophagitis: Secondary | ICD-10-CM | POA: Diagnosis not present

## 2019-02-09 LAB — COMPREHENSIVE METABOLIC PANEL
ALT: 24 U/L (ref 0–35)
AST: 20 U/L (ref 0–37)
Albumin: 4.5 g/dL (ref 3.5–5.2)
Alkaline Phosphatase: 45 U/L (ref 39–117)
BUN: 11 mg/dL (ref 6–23)
CO2: 28 mEq/L (ref 19–32)
Calcium: 9.6 mg/dL (ref 8.4–10.5)
Chloride: 104 mEq/L (ref 96–112)
Creatinine, Ser: 0.74 mg/dL (ref 0.40–1.20)
GFR: 77 mL/min (ref >60.00)
Glucose, Bld: 158 mg/dL — ABNORMAL HIGH (ref 70–99)
Potassium: 3.4 mEq/L — ABNORMAL LOW (ref 3.5–5.1)
Sodium: 141 mEq/L (ref 135–145)
Total Bilirubin: 0.7 mg/dL (ref 0.2–1.2)
Total Protein: 7 g/dL (ref 6.0–8.3)

## 2019-02-09 LAB — CBC WITH DIFFERENTIAL/PLATELET
Basophils Absolute: 0.1 10*3/uL (ref 0.0–0.1)
Basophils Relative: 0.9 % (ref 0.0–3.0)
Eosinophils Absolute: 0.2 10*3/uL (ref 0.0–0.7)
Eosinophils Relative: 2.8 % (ref 0.0–5.0)
HCT: 40.4 % (ref 36.0–46.0)
Hemoglobin: 13.7 g/dL (ref 12.0–15.0)
Lymphocytes Relative: 43.7 % (ref 12.0–46.0)
Lymphs Abs: 2.8 10*3/uL (ref 0.7–4.0)
MCHC: 34 g/dL (ref 30.0–36.0)
MCV: 94.2 fl (ref 78.0–100.0)
Monocytes Absolute: 0.5 10*3/uL (ref 0.1–1.0)
Monocytes Relative: 7.6 % (ref 3.0–12.0)
Neutro Abs: 2.9 10*3/uL (ref 1.4–7.7)
Neutrophils Relative %: 45 % (ref 43.0–77.0)
Platelets: 193 10*3/uL (ref 150.0–400.0)
RBC: 4.29 Mil/uL (ref 3.87–5.11)
RDW: 12.6 % (ref 11.5–15.5)
WBC: 6.4 10*3/uL (ref 4.0–10.5)

## 2019-02-09 LAB — LIPASE: Lipase: 20 U/L (ref 11.0–59.0)

## 2019-02-09 MED ORDER — PROMETHAZINE HCL 25 MG PO TABS: 25.0000 mg | ORAL_TABLET | Freq: Three times a day (TID) | ORAL | 0 refills | Status: DC | PRN

## 2019-02-09 NOTE — Progress Notes (Signed)
Chief Complaint: Epigastric pain  Referring Provider:  Raina Mina., MD      ASSESSMENT AND PLAN;   #1.  Epigastric pain with N/V. Pt has complicated biliary history.  S/P lap chole 11/2011 with cystic stump leak s/p ERCP with biliary sphincterotomy/stent insertion 12/06/2011, s/p removal therafter. [place] 01/2018 - showing very early cirrhosis per report (doubt significance), Nl LFTs with alb 4.6 01/2018   #2. GERD with small HH. UGI with SB series 01/2018- small asymptomatic Zenker's, small hiatal hernia.  Otherwise Nl. Neg EGD 02/2018 except for small HH fundic gland polyps.  #3. Hematuria d/t bladder Ca s/p TURBT (Dr Emi Holes) 01/18/2019  Plan: - Continue protonix 40mg  po qd. #30, 11 refills. - CT scan abdomen and pelvis with PO and IV - CBC, CMP, lipase. - GERD brochures given. - Continue bentyl 10mg  po qid prn , #120. - Phenergan 25mg  po Q8 hrs prn. #20, sedation precautions. 2 refills - RTC in 6 months.    HPI:    Kara Reynolds is a 73 y.o. female Andrea's mom (NP with Dr Georga Bora) For follow-up visit Had painless hematuria, evaluated by Dr. Emi Holes.  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  Per biopsies, complete resection.  Has been quite stressed out.  Started having nausea/vomiting.  Zofran does not help.  She has tried Phenergan with good relief.  Does not make her sedated.  Does admit that stress causes her to have more nausea/vomiting and regurgitation.  No weight loss.  She denies having any diarrhea or constipation currently.  Past GI procedures: -EGD10/2019-small hiatal hernia, fundic gland polyps.  Negative HP.  03/15/2014: Small hiatal hernia, mild gastritis, neg SB Bx for celiac, negative biopsies for HP. -History of IBS with alternating diarrhea and constipation, last colonoscopy 07/09/2016 showing pancolonic diverticulosis predominantly in the sigmoid colon.    Past Medical History:  Diagnosis Date  . Acute cystitis  without hematuria 07/03/2016  . Anxiety 06/29/2015   Last Assessment & Plan:  Needs refill on this does not use it daily at all , but it does help her and she would like to continue with this and never fills this consistently   . Chest discomfort 10/29/2016  . Colon polyp   . Encounter for screening colonoscopy 01/03/2016  . GERD (gastroesophageal reflux disease) 06/29/2015   Last Assessment & Plan:  We discussed the side effects of the PPIs and use of zantac alternating if she would like, and she feels the PPI is needed for her GERD and does helps her, discussed bone loss as well as memory issues with the class as well  . Hypercholesteremia 06/29/2015   Last Assessment & Plan:  Update her fasting labs for her today and discussed diet which she and her husband are working on more with her weight as well  . IBS (irritable bowel syndrome)   . Malaise and fatigue 07/02/2015   Last Assessment & Plan:  Off and on for her, she tries to remain active is retired now worked at [employer] and has had blood exposure and she is going to have the hepatitis C panel drawn for this purpose  . 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 Osteoporosis 06/29/2015   Overview:  Recommend meds. She isnt willing.  Ordering a new dexa.  Last Assessment & Plan:  Update her vitamin d level, she is not on meds for this and  had her last exam this year in februray  . Other emphysema ([place]) 07/03/2016  . Palpitations 10/29/2016  . Paroxysmal atrial tachycardia ([place]) 10/29/2016  . Postmenopausal 06/29/2015   Last Assessment & Plan:  Not using any hormones, she feels she is doing well and has her mammograms done at [place]  . Vitamin D deficiency 06/29/2015   Last Assessment & Plan:  Update her level for her today and see where she is at    Past Surgical History:  Procedure Laterality Date  . APPENDECTOMY    . BLADDER SURGERY  01/13/2019  . CHOLECYSTECTOMY    .  TONSILLECTOMY     as a teenager    Family History  Problem Relation Age of Onset  . Prostate cancer Father        died about 15 years ago  . Stroke Maternal Grandfather   . Diabetes Child   . Colon cancer Neg Hx   . Esophageal cancer Neg Hx     Social History   Tobacco Use  . Smoking status: Never Smoker  . Smokeless tobacco: Never Used  Substance Use Topics  . Alcohol use: No  . Drug use: Never    Current Outpatient Medications  Medication Sig Dispense Refill  . ALPRAZolam (XANAX) 0.5 MG tablet Take 0.5 mg by mouth at bedtime as needed for anxiety.    . dicyclomine (BENTYL) 10 MG capsule Take 1 capsule (10 mg total) by mouth 2 (two) times daily. (Patient taking differently: Take 10 mg by mouth as needed. In the morning) 60 capsule 6  . metoprolol tartrate (LOPRESSOR) 25 MG tablet Take 2 tablets (50 mg total) daily as needed by mouth. (Patient taking differently: Take 25 mg by mouth as needed. ) 30 tablet 1  . pantoprazole (PROTONIX) 40 MG tablet Take 1 tablet (40 mg total) by mouth daily. 30 tablet 11   No current facility-administered medications for this visit.     Allergies  Allergen Reactions  . Amoxicillin-Pot Clavulanate Nausea Only    Review of Systems:  Has situational anxiety      Physical Exam:    BP 136/80   Pulse 62   Temp 98.1 F (36.7 C)   Ht 5\' 6"  (1.676 m)   Wt 149 lb 8 oz (67.8 kg)   BMI 24.13 kg/m  Filed Weights   02/09/19 1105  Weight: 149 lb 8 oz (67.8 kg)   Constitutional:  Well-developed, in no acute distress. Psychiatric: Normal mood and affect. Behavior is normal. HEENT: Pupils normal.  Conjunctivae are normal. No scleral icterus. Cardiovascular: Normal rate, regular rhythm. No edema Pulmonary/chest: Effort normal and breath sounds normal. No wheezing, rales or rhonchi. Abdominal: Soft, nondistended. Nontender. Bowel sounds active throughout. There are no masses palpable. No hepatomegaly.  25 minutes spent with the patient  today. Greater than 50% was spent in counseling and coordination of care with the patient    Carmell Austria, MD 02/09/2019, 11:17 AM  Cc: Raina Mina., MD

## 2019-02-09 NOTE — Patient Instructions (Signed)
If you are age 72 or older, your body mass index should be between 23-30. Your Body mass index is 24.13 kg/m. If this is out of the aforementioned range listed, please consider follow up with your Primary Care Provider.  If you are age 49 or younger, your body mass index should be between 19-25. Your Body mass index is 24.13 kg/m. If this is out of the aformentioned range listed, please consider follow up with your Primary Care Provider.   You have been [facility] [place] [address] [address] [facility]   You are scheduled on 02/17/19 at [place] should arrive 15 minutes prior to your appointment time for registration. Please follow the written instructions below on the day of your exam:  WARNING: IF YOU ARE ALLERGIC TO IODINE/X-RAY DYE, PLEASE NOTIFY RADIOLOGY IMMEDIATELY AT [phone]! YOU WILL BE GIVEN A 13 HOUR PREMEDICATION PREP.  1) Do not eat or drink anything after 6am (4 hours prior to your test) 2) You have been given 2 bottles of oral contrast to drink. The solution may taste better if refrigerated, but do NOT add ice or any other liquid to this solution. Shake well before drinking.    Drink 1 bottle of contrast @ 8am (2 hours prior to your exam)  Drink 1 bottle of contrast @ 9am (1 hour prior to your exam)  You may take any medications as prescribed with a small amount of water, if necessary. If you take any of the following medications: METFORMIN, GLUCOPHAGE, GLUCOVANCE, AVANDAMET, RIOMET, FORTAMET, [place] MET, JANUMET, GLUMETZA or METAGLIP, you MAY be asked to HOLD this medication 48 hours AFTER the exam.  The purpose of you drinking the oral contrast is to aid in the visualization of your intestinal tract. The contrast solution may cause some diarrhea. Depending on your individual set of symptoms, you may also receive an intravenous injection of x-ray contrast/dye. Plan on being at  [facility] for 30 minutes or longer, depending on the type of exam you are having performed.  This test typically takes 30-45 minutes to complete.  If you have any questions regarding your exam or if you need to reschedule, you may call the CT department at [phone] between the hours of 8:00 am and 5:00 pm, Monday-Friday.  ________________________________________________________________________   Please go to the lab at [facility] ([place] You will need to go to level "B", you do not need an appointment for this. Hours available are 7:30 am - 4:30 pm. You will need to have this blood work done before your CT scan.   We have sent the following medications to your pharmacy for you to pick up at your convenience: Phenrgan 25 mg every 8 hours as needed.   Follow up in 6 months.   Thank you,  Dr. Jackquline Denmark

## 2019-02-11 ENCOUNTER — Other Ambulatory Visit: Payer: Self-pay

## 2019-02-11 DIAGNOSIS — E876 Hypokalemia: Secondary | ICD-10-CM

## 2019-02-11 DIAGNOSIS — R112 Nausea with vomiting, unspecified: Secondary | ICD-10-CM

## 2019-02-11 DIAGNOSIS — R1013 Epigastric pain: Secondary | ICD-10-CM

## 2019-02-11 DIAGNOSIS — D131 Benign neoplasm of stomach: Secondary | ICD-10-CM

## 2019-02-11 MED ORDER — POTASSIUM CHLORIDE CRYS ER 20 MEQ PO TBCR: 20.0000 meq | EXTENDED_RELEASE_TABLET | Freq: Every day | ORAL | 0 refills | Status: DC

## 2019-02-14 ENCOUNTER — Telehealth: Payer: Self-pay | Admitting: Gastroenterology

## 2019-02-14 NOTE — Telephone Encounter (Signed)
Called and spoke with patient-patient informed of K+ level and instructed again on importance of taking Ku-dur medication as prescribed by MD; Patient verbalized understanding of information/instructions; patient advised to call back to the office should further questions/concerns arise;

## 2019-02-17 ENCOUNTER — Other Ambulatory Visit (INDEPENDENT_AMBULATORY_CARE_PROVIDER_SITE_OTHER): Payer: Medicare HMO

## 2019-02-17 ENCOUNTER — Ambulatory Visit (HOSPITAL_BASED_OUTPATIENT_CLINIC_OR_DEPARTMENT_OTHER)
Admission: RE | Admit: 2019-02-17 | Discharge: 2019-02-17 | Disposition: A | Discharge status: Home / Self Care | Payer: Medicare HMO | Source: Ambulatory Visit | Attending: Gastroenterology | Admitting: Gastroenterology

## 2019-02-17 ENCOUNTER — Encounter (HOSPITAL_BASED_OUTPATIENT_CLINIC_OR_DEPARTMENT_OTHER): Payer: Self-pay

## 2019-02-17 ENCOUNTER — Other Ambulatory Visit: Payer: Self-pay

## 2019-02-17 DIAGNOSIS — R1013 Epigastric pain: Secondary | ICD-10-CM | POA: Diagnosis present

## 2019-02-17 DIAGNOSIS — D131 Benign neoplasm of stomach: Secondary | ICD-10-CM | POA: Diagnosis not present

## 2019-02-17 DIAGNOSIS — R112 Nausea with vomiting, unspecified: Secondary | ICD-10-CM | POA: Insufficient documentation

## 2019-02-17 DIAGNOSIS — K219 Gastro-esophageal reflux disease without esophagitis: Secondary | ICD-10-CM

## 2019-02-17 DIAGNOSIS — E876 Hypokalemia: Secondary | ICD-10-CM | POA: Diagnosis not present

## 2019-02-17 LAB — BASIC METABOLIC PANEL
BUN: 10 mg/dL (ref 6–23)
CO2: 27 mEq/L (ref 19–32)
Calcium: 9.6 mg/dL (ref 8.4–10.5)
Chloride: 101 mEq/L (ref 96–112)
Creatinine, Ser: 0.75 mg/dL (ref 0.40–1.20)
GFR: 75.81 mL/min (ref >60.00)
Glucose, Bld: 99 mg/dL (ref 70–99)
Potassium: 4.7 mEq/L (ref 3.5–5.1)
Sodium: 137 mEq/L (ref 135–145)

## 2019-02-17 MED ORDER — IOHEXOL 300 MG/ML  SOLN
100.0000 mL | Freq: Once | INTRAMUSCULAR | Status: AC | PRN
  Administered 2019-02-17: 100 mL via INTRAVENOUS

## 2019-06-11 ENCOUNTER — Other Ambulatory Visit: Payer: Self-pay | Admitting: Gastroenterology

## 2020-06-01 IMAGING — CT CT ABD-PELV W/ CM
2 of 5 series · 16 of 46 positions shown, 18 images · IV contrast (APPLIED)
Comparison: 10/09/2017

CLINICAL DATA: Nausea and vomiting with bilateral mid upper
abdominal pain. History of bladder "tumor".

EXAM:
CT ABDOMEN AND PELVIS WITH CONTRAST
TECHNIQUE: Multidetector CT imaging of the abdomen and pelvis was performed
using the standard protocol following bolus administration of
intravenous contrast.
CONTRAST:  100mL OMNIPAQUE IOHEXOL 300 MG/ML  SOLN

[Series 2: axial st · axial · 0.84mm/px · z∈[-666,-286]mm · 13 of 88 slices shown, 15 images]
[im 6/88  soft-tissue]
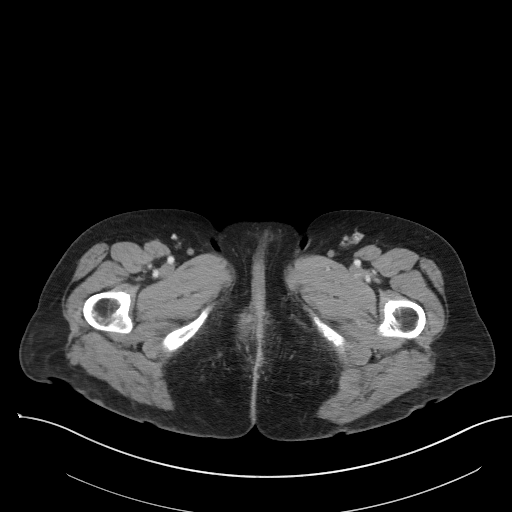
[im 6/88  bone]
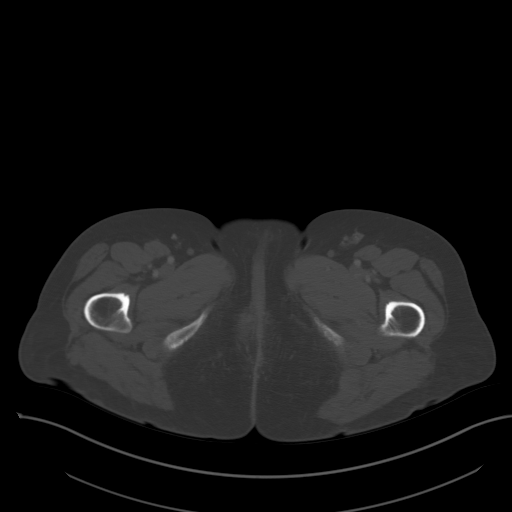
[im 12/88  soft-tissue]
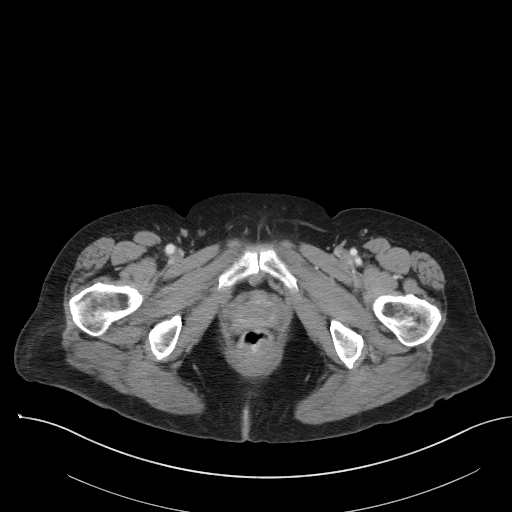
[im 18/88  soft-tissue]
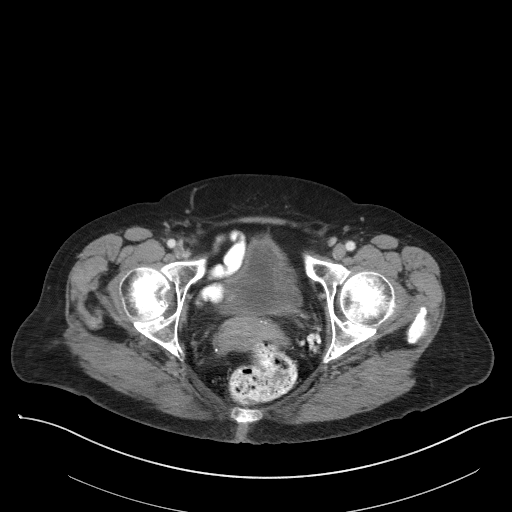
[im 24/88  soft-tissue]
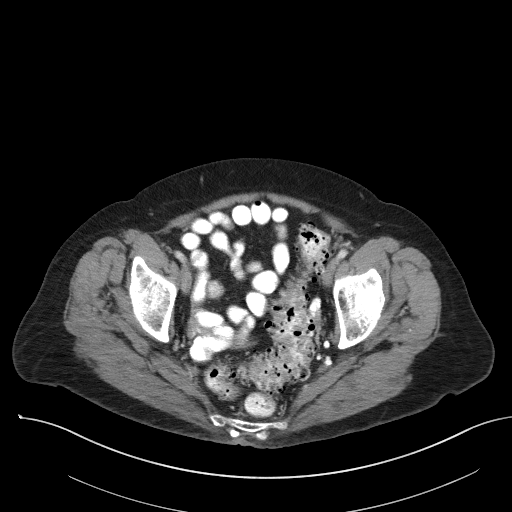
[im 30/88  soft-tissue]
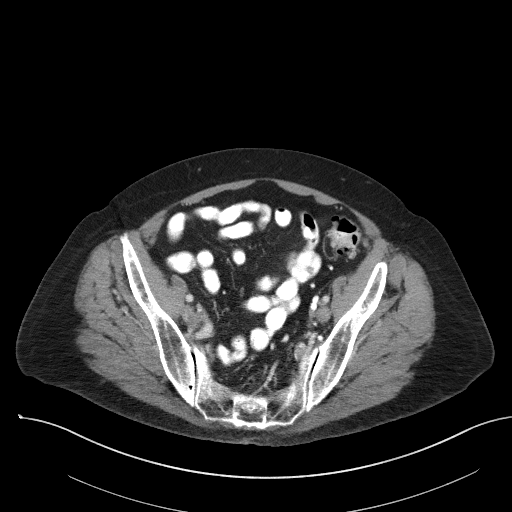
[im 35/88  soft-tissue]
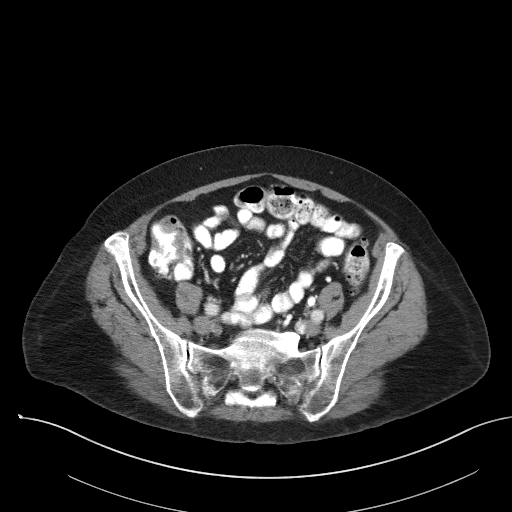
[im 47/88  soft-tissue]
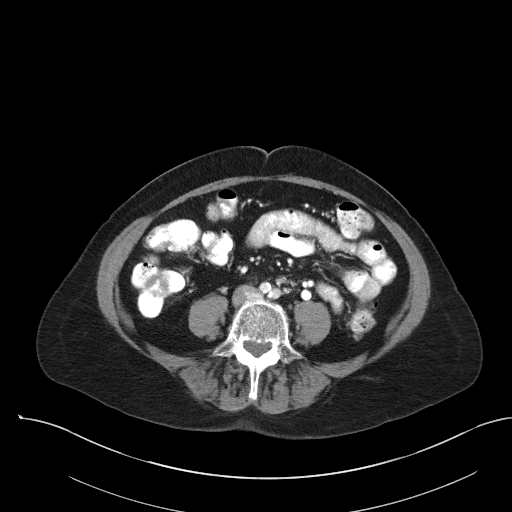
[im 53/88  soft-tissue]
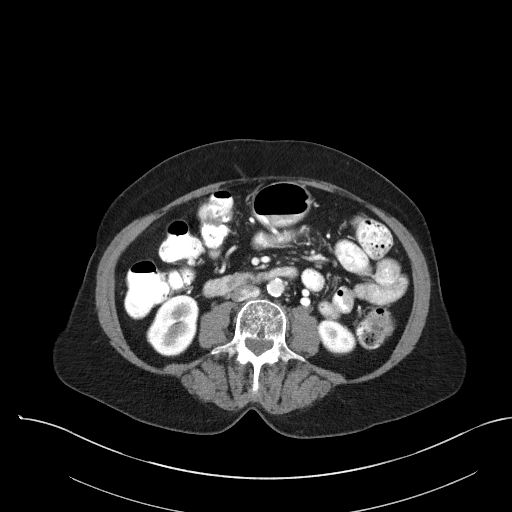
[im 59/88  soft-tissue]
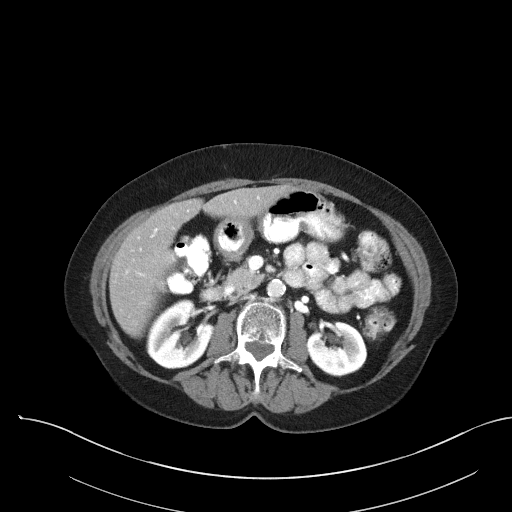
[im 59/88  bone]
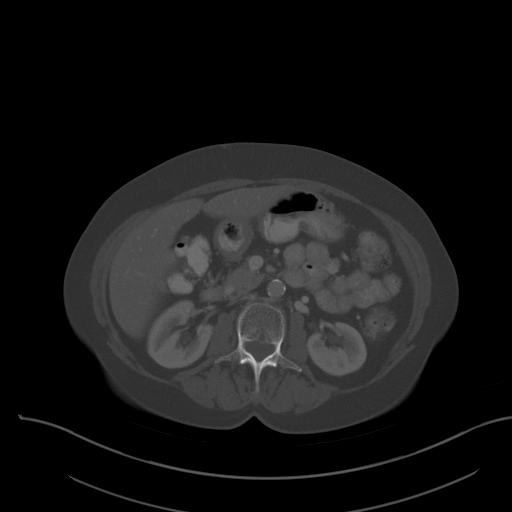
[im 64/88  soft-tissue]
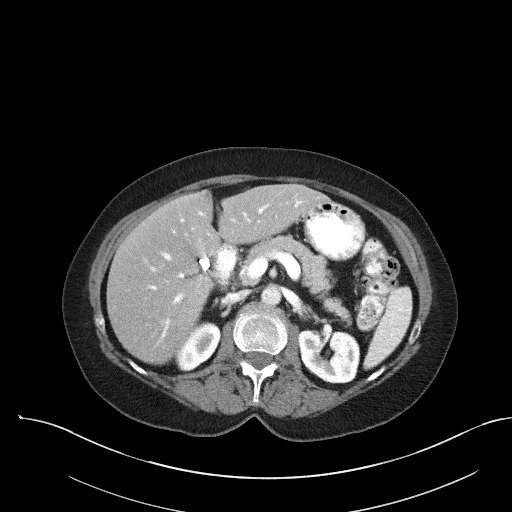
[im 70/88  soft-tissue]
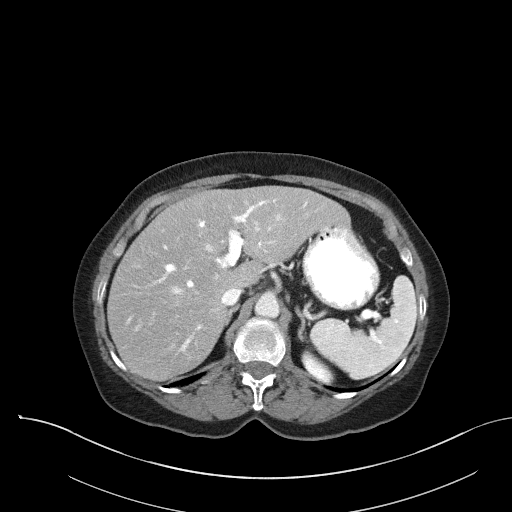
[im 76/88  soft-tissue]
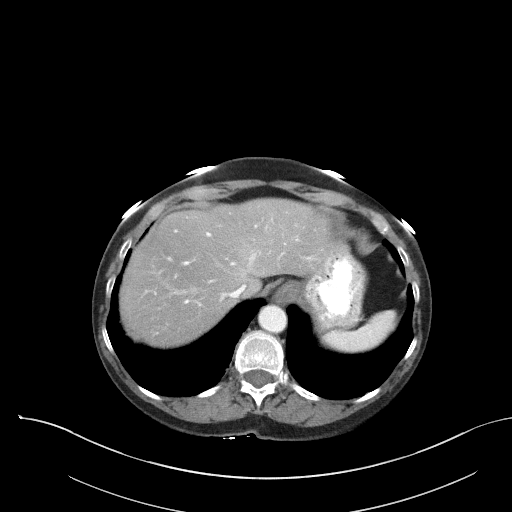
[im 82/88  soft-tissue]
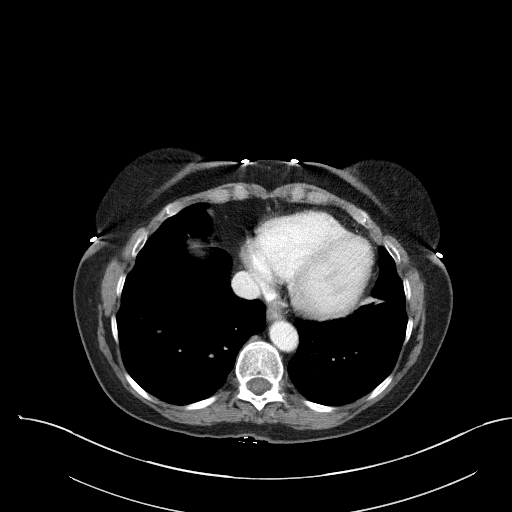

[Series 5: coronal st · coronal · 0.69mm/px · 3 of 89 slices shown]
[im 30/89  soft-tissue]
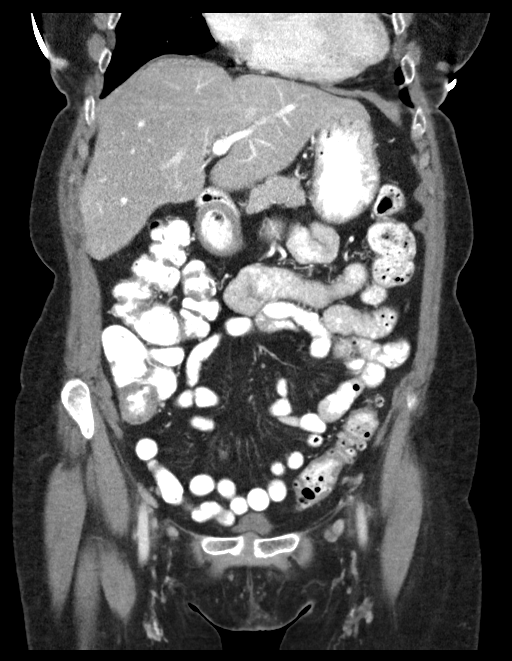
[im 40/89  soft-tissue]
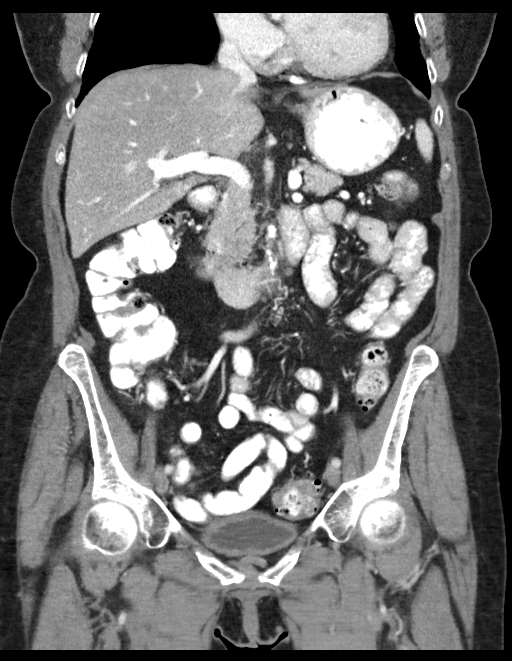
[im 49/89  soft-tissue]
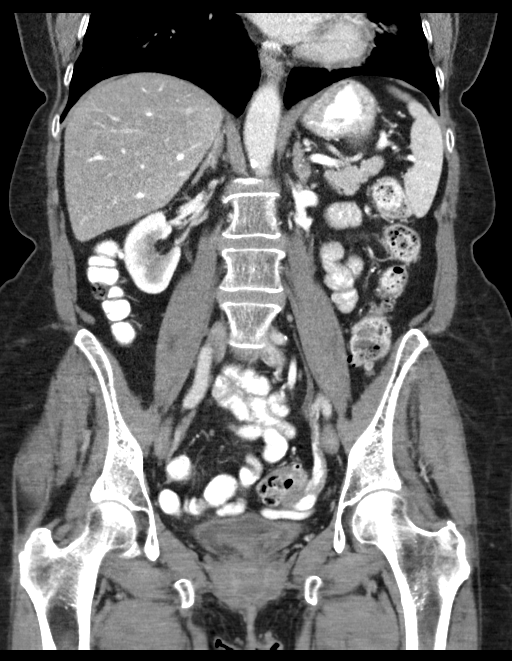

[16 of 46 positions shown; findings below may reference images not displayed]

FINDINGS: Lower chest: Unremarkable.

Hepatobiliary: No suspicious focal abnormality within the liver
parenchyma. Gallbladder is surgically absent. No intrahepatic or
extrahepatic biliary dilation.

Pancreas: No focal mass lesion. No dilatation of the main duct. No
intraparenchymal cyst. No peripancreatic edema.

Spleen: No splenomegaly. No focal mass lesion.

Adrenals/Urinary Tract: No adrenal nodule or mass. Kidneys
unremarkable. No evidence for hydroureter. Underdistention
accentuates bladder wall thickness.

Stomach/Bowel: Stomach is unremarkable. No gastric wall thickening.
No evidence of outlet obstruction. Duodenum is normally positioned
as is the ligament of Treitz. No small bowel wall thickening. No
small bowel dilatation. The terminal ileum is normal. The appendix
is not visualized, but there is no edema or inflammation in the
region of the cecum. No gross colonic mass. No colonic wall
thickening. Diverticular changes are noted in the left colon without
evidence of diverticulitis.

Vascular/Lymphatic: There is abdominal aortic atherosclerosis
without aneurysm. There is no gastrohepatic or hepatoduodenal
ligament lymphadenopathy. No intraperitoneal or retroperitoneal
lymphadenopathy. No pelvic sidewall lymphadenopathy.

Reproductive: The uterus is unremarkable.  There is no adnexal mass.

Other: No intraperitoneal free fluid.

Musculoskeletal: No worrisome lytic or sclerotic osseous
abnormality.
IMPRESSION: 1. No acute findings in the abdomen or pelvis. Specifically, no
findings to explain the patient's history of abdominal pain with
nausea and vomiting.
2. Left colonic diverticulosis without diverticulitis.
3. Abdominal aortic atherosclerosis.

Aortic Atherosclerosis ([id number]).

## 2022-05-28 ENCOUNTER — Encounter: Payer: Self-pay | Admitting: Gastroenterology

## 2022-06-03 ENCOUNTER — Other Ambulatory Visit: Payer: Self-pay | Admitting: Urology

## 2022-06-04 ENCOUNTER — Other Ambulatory Visit: Payer: Self-pay | Admitting: Urology

## 2022-06-05 NOTE — Patient Instructions (Signed)
SURGICAL WAITING ROOM VISITATION  Patients having surgery or a procedure may have no more than 2 support people in the waiting area - these visitors may rotate.    Children under the age of 54 must have an adult with them who is not the patient.  Due to an increase in RSV and influenza rates and associated hospitalizations, children ages 46 and under may not visit patients in [place].  If the patient needs to stay at the hospital during part of their recovery, the visitor guidelines for inpatient rooms apply. Pre-op nurse will coordinate an appropriate time for 1 support person to accompany patient in pre-op.  This support person may not rotate.    Please refer to the [facility] website for the visitor guidelines for Inpatients (after your surgery is over and you are in a regular room).       Your procedure is scheduled on:  06/16/22    Report to [facility] Main Entrance    Report to admitting at  1015 AM   Call this number if you have problems the morning of surgery [phone]   Do not eat food  or drink liquids :After Midnight.                If you have questions, please contact your surgeon's office.       Oral Hygiene is also important to reduce your risk of infection.                                    Remember - BRUSH YOUR TEETH THE MORNING OF SURGERY WITH YOUR REGULAR TOOTHPASTE  DENTURES WILL BE REMOVED PRIOR TO SURGERY PLEASE DO NOT APPLY "Poly grip" OR ADHESIVES!!!   Do NOT smoke after Midnight   Take these medicines the morning of surgery with A SIP OF WATER:  lopressor if neede, protonix   DO NOT TAKE ANY ORAL DIABETIC MEDICATIONS DAY OF YOUR SURGERY  Bring CPAP mask and tubing day of surgery.                              You may not have any metal on your body including hair pins, jewelry, and body piercing             Do not wear make-up, lotions, powders, perfumes/cologne, or deodorant  Do not wear nail polish including gel  and S&S, artificial/acrylic nails, or any other type of covering on natural nails including finger and toenails. If you have artificial nails, gel coating, etc. that needs to be removed by a nail salon please have this removed prior to surgery or surgery may need to be canceled/ delayed if the surgeon/ anesthesia feels like they are unable to be safely monitored.   Do not shave  48 hours prior to surgery.               Men may shave face and neck.   Do not bring valuables to the hospital. [place].   Contacts, glasses, dentures or bridgework may not be worn into surgery.   Bring small overnight bag day of surgery.   DO NOT [place]. PHARMACY WILL DISPENSE MEDICATIONS LISTED ON YOUR MEDICATION LIST TO YOU  DURING YOUR ADMISSION IN [place]!    Patients discharged on the day of surgery will not be allowed to drive home.  Someone NEEDS to stay with you for the first 24 hours after anesthesia.   Special Instructions: Bring a copy of your healthcare power of attorney and living will documents the day of surgery if you haven't scanned them before.              Please read over the following fact sheets you were given: IF [place] [phone]   If you received a COVID test during your pre-op visit  it is requested that you wear a mask when out in public, stay away from anyone that may not be feeling well and notify your surgeon if you develop symptoms. If you test positive for Covid or have been in contact with anyone that has tested positive in the last 10 days please notify you surgeon.    [place] - Preparing for Surgery Before surgery, you can play an important role.  Because skin is not sterile, your skin needs to be as free of germs as possible.  You can reduce the number of germs on your skin by washing with CHG (chlorahexidine gluconate) soap  before surgery.  CHG is an antiseptic cleaner which kills germs and bonds with the skin to continue killing germs even after washing. Please DO NOT use if you have an allergy to CHG or antibacterial soaps.  If your skin becomes reddened/irritated stop using the CHG and inform your nurse when you arrive at Short Stay. Do not shave (including legs and underarms) for at least 48 hours prior to the first CHG shower.  You may shave your face/neck. Please follow these instructions carefully:  1.  Shower with CHG Soap the night before surgery and the  morning of Surgery.  2.  If you choose to wash your hair, wash your hair first as usual with your  normal  shampoo.  3.  After you shampoo, rinse your hair and body thoroughly to remove the  shampoo.                           4.  Use CHG as you would any other liquid soap.  You can apply chg directly  to the skin and wash                       Gently with a scrungie or clean washcloth.  5.  Apply the CHG Soap to your body ONLY FROM THE NECK DOWN.   Do not use on face/ open                           Wound or open sores. Avoid contact with eyes, ears mouth and genitals (private parts).                       Wash face,  Genitals (private parts) with your normal soap.             6.  Wash thoroughly, paying special attention to the area where your surgery  will be performed.  7.  Thoroughly rinse your body with warm water from the neck down.  8.  DO NOT shower/wash with your normal soap after using and rinsing off  the CHG Soap.  9.  Pat yourself dry with a clean towel.            10.  Wear clean pajamas.            11.  Place clean sheets on your bed the night of your first shower and do not  sleep with pets. Day of Surgery : Do not apply any lotions/deodorants the morning of surgery.  Please wear clean clothes to the hospital/surgery center.  FAILURE TO FOLLOW THESE INSTRUCTIONS MAY RESULT IN THE CANCELLATION OF YOUR SURGERY PATIENT  SIGNATURE_________________________________  NURSE SIGNATURE__________________________________  ________________________________________________________________________

## 2022-06-05 NOTE — Progress Notes (Addendum)
Anesthesia Review:  PCP: Edyth Gunnels LOV 03/03/22  Cardiologist : DR Abran Richard LOV 02/14/22  Chest x-ray : EKG : 06/09/22  Echo : Stress test: Cardiac Cath :  Activity level:  can do a flight of stairs without difficutly  Sleep Study/ CPAP : none  Fasting Blood Sugar :      / Checks Blood Sugar -- times a day:   Blood Thinner/ Instructions /Last Dose: ASA / Instructions/ Last Dose :

## 2022-06-09 ENCOUNTER — Encounter (HOSPITAL_COMMUNITY)
Admission: RE | Admit: 2022-06-09 | Discharge: 2022-06-09 | Disposition: A | Discharge status: Home / Self Care | Payer: Medicare HMO | Source: Ambulatory Visit | Attending: Urology | Admitting: Urology

## 2022-06-09 ENCOUNTER — Other Ambulatory Visit: Payer: Self-pay

## 2022-06-09 ENCOUNTER — Encounter (HOSPITAL_COMMUNITY): Payer: Self-pay

## 2022-06-09 VITALS — BP 148/81 | HR 67 | Temp 98.1°F | Resp 16 | Ht 64.0 in | Wt 148.0 lb

## 2022-06-09 DIAGNOSIS — Z01818 Encounter for other preprocedural examination: Secondary | ICD-10-CM | POA: Insufficient documentation

## 2022-06-09 HISTORY — DX: Nonrheumatic mitral (valve) insufficiency: I34.0

## 2022-06-09 HISTORY — DX: Malignant (primary) neoplasm, unspecified: C80.1

## 2022-06-09 LAB — BASIC METABOLIC PANEL
Anion gap: 8 (ref 5–15)
BUN: 10 mg/dL (ref 8–23)
CO2: 28 mmol/L (ref 22–32)
Calcium: 9.1 mg/dL (ref 8.9–10.3)
Chloride: 104 mmol/L (ref 98–111)
Creatinine, Ser: 0.68 mg/dL (ref 0.44–1.00)
GFR, Estimated: >60 mL/min (ref >60)
Glucose, Bld: 99 mg/dL (ref 70–99)
Potassium: 4 mmol/L (ref 3.5–5.1)
Sodium: 140 mmol/L (ref 135–145)

## 2022-06-09 LAB — CBC
HCT: 41.5 % (ref 36.0–46.0)
Hemoglobin: 13.8 g/dL (ref 12.0–15.0)
MCH: 31.1 pg (ref 26.0–34.0)
MCHC: 33.3 g/dL (ref 30.0–36.0)
MCV: 93.5 fL (ref 80.0–100.0)
Platelets: 251 10*3/uL (ref 150–400)
RBC: 4.44 MIL/uL (ref 3.87–5.11)
RDW: 12.1 % (ref 11.5–15.5)
WBC: 7 10*3/uL (ref 4.0–10.5)
nRBC: 0 % (ref 0.0–0.2)

## 2022-06-16 ENCOUNTER — Encounter (HOSPITAL_COMMUNITY)
Admission: RE | Disposition: A | Discharge status: Home / Self Care | Payer: Self-pay | Source: Ambulatory Visit | Attending: Urology

## 2022-06-16 ENCOUNTER — Other Ambulatory Visit: Payer: Self-pay

## 2022-06-16 ENCOUNTER — Ambulatory Visit (HOSPITAL_BASED_OUTPATIENT_CLINIC_OR_DEPARTMENT_OTHER): Payer: Medicare HMO | Admitting: Anesthesiology

## 2022-06-16 ENCOUNTER — Ambulatory Visit (HOSPITAL_COMMUNITY)
Admission: RE | Admit: 2022-06-16 | Discharge: 2022-06-16 | Disposition: A | Discharge status: Home / Self Care | Payer: Medicare HMO | Source: Ambulatory Visit | Attending: Urology | Admitting: Urology

## 2022-06-16 ENCOUNTER — Encounter (HOSPITAL_COMMUNITY): Payer: Self-pay | Admitting: Urology

## 2022-06-16 ENCOUNTER — Ambulatory Visit (HOSPITAL_COMMUNITY): Payer: Medicare HMO | Admitting: Physician Assistant

## 2022-06-16 ENCOUNTER — Ambulatory Visit (HOSPITAL_COMMUNITY): Payer: Medicare HMO

## 2022-06-16 DIAGNOSIS — K219 Gastro-esophageal reflux disease without esophagitis: Secondary | ICD-10-CM | POA: Insufficient documentation

## 2022-06-16 DIAGNOSIS — D494 Neoplasm of unspecified behavior of bladder: Secondary | ICD-10-CM | POA: Diagnosis not present

## 2022-06-16 DIAGNOSIS — J449 Chronic obstructive pulmonary disease, unspecified: Secondary | ICD-10-CM | POA: Diagnosis not present

## 2022-06-16 DIAGNOSIS — I081 Rheumatic disorders of both mitral and tricuspid valves: Secondary | ICD-10-CM | POA: Insufficient documentation

## 2022-06-16 DIAGNOSIS — C676 Malignant neoplasm of ureteric orifice: Secondary | ICD-10-CM | POA: Diagnosis present

## 2022-06-16 DIAGNOSIS — F419 Anxiety disorder, unspecified: Secondary | ICD-10-CM | POA: Diagnosis not present

## 2022-06-16 DIAGNOSIS — Z01818 Encounter for other preprocedural examination: Secondary | ICD-10-CM

## 2022-06-16 HISTORY — PX: CYSTOSCOPY W/ URETERAL STENT PLACEMENT: SHX1429

## 2022-06-16 SURGERY — TURBT, WITH CHEMOTHERAPEUTIC AGENT INSTILLATION INTO BLADDER
Anesthesia: General

## 2022-06-16 MED ORDER — MIDAZOLAM HCL 5 MG/5ML IJ SOLN
INTRAMUSCULAR | Status: DC | PRN
  Administered 2022-06-16: 1 mg via INTRAVENOUS

## 2022-06-16 MED ORDER — SODIUM CHLORIDE 0.9 % IR SOLN
Status: DC | PRN
  Administered 2022-06-16: 6000 mL

## 2022-06-16 MED ORDER — AMISULPRIDE (ANTIEMETIC) 5 MG/2ML IV SOLN
10.0000 mg | Freq: Once | INTRAVENOUS | Status: AC
  Administered 2022-06-16: 10 mg via INTRAVENOUS

## 2022-06-16 MED ORDER — FENTANYL CITRATE PF 50 MCG/ML IJ SOSY
PREFILLED_SYRINGE | INTRAMUSCULAR | Status: AC
  Filled 2022-06-16: qty 2

## 2022-06-16 MED ORDER — TRAMADOL HCL 50 MG PO TABS: 50.0000 mg | ORAL_TABLET | Freq: Four times a day (QID) | ORAL | 0 refills | Status: DC | PRN

## 2022-06-16 MED ORDER — PROPOFOL 10 MG/ML IV BOLUS
INTRAVENOUS | Status: DC | PRN
  Administered 2022-06-16: 100 mg via INTRAVENOUS

## 2022-06-16 MED ORDER — MIDAZOLAM HCL 2 MG/2ML IJ SOLN
INTRAMUSCULAR | Status: AC
  Filled 2022-06-16: qty 2

## 2022-06-16 MED ORDER — ORAL CARE MOUTH RINSE: 15.0000 mL | Freq: Once | OROMUCOSAL | Status: AC

## 2022-06-16 MED ORDER — FENTANYL CITRATE (PF) 100 MCG/2ML IJ SOLN
INTRAMUSCULAR | Status: AC
  Filled 2022-06-16: qty 2

## 2022-06-16 MED ORDER — FENTANYL CITRATE PF 50 MCG/ML IJ SOSY
25.0000 ug | PREFILLED_SYRINGE | INTRAMUSCULAR | Status: DC | PRN
  Administered 2022-06-16 (×2): 50 ug via INTRAVENOUS

## 2022-06-16 MED ORDER — DEXAMETHASONE SODIUM PHOSPHATE 10 MG/ML IJ SOLN
INTRAMUSCULAR | Status: DC | PRN
  Administered 2022-06-16: 5 mg via INTRAVENOUS

## 2022-06-16 MED ORDER — IOHEXOL 300 MG/ML  SOLN
INTRAMUSCULAR | Status: DC | PRN
  Administered 2022-06-16: 10 mL

## 2022-06-16 MED ORDER — ONDANSETRON HCL 4 MG/2ML IJ SOLN
INTRAMUSCULAR | Status: AC
  Filled 2022-06-16: qty 2

## 2022-06-16 MED ORDER — 0.9 % SODIUM CHLORIDE (POUR BTL) OPTIME
TOPICAL | Status: DC | PRN
  Administered 2022-06-16: 1000 mL

## 2022-06-16 MED ORDER — AMISULPRIDE (ANTIEMETIC) 5 MG/2ML IV SOLN
INTRAVENOUS | Status: AC
  Filled 2022-06-16: qty 4

## 2022-06-16 MED ORDER — ROCURONIUM BROMIDE 10 MG/ML (PF) SYRINGE
PREFILLED_SYRINGE | INTRAVENOUS | Status: AC
  Filled 2022-06-16: qty 10

## 2022-06-16 MED ORDER — ACETAMINOPHEN 500 MG PO TABS
1000.0000 mg | ORAL_TABLET | Freq: Once | ORAL | Status: AC
  Administered 2022-06-16: 1000 mg via ORAL
  Filled 2022-06-16: qty 2

## 2022-06-16 MED ORDER — GEMCITABINE CHEMO FOR BLADDER INSTILLATION 2000 MG: 2000.0000 mg | Freq: Once | INTRAVENOUS | Status: DC

## 2022-06-16 MED ORDER — DEXAMETHASONE SODIUM PHOSPHATE 10 MG/ML IJ SOLN
INTRAMUSCULAR | Status: AC
  Filled 2022-06-16: qty 1

## 2022-06-16 MED ORDER — ONDANSETRON HCL 4 MG/2ML IJ SOLN
INTRAMUSCULAR | Status: DC | PRN
  Administered 2022-06-16: 4 mg via INTRAVENOUS

## 2022-06-16 MED ORDER — LIDOCAINE HCL (PF) 2 % IJ SOLN
INTRAMUSCULAR | Status: AC
  Filled 2022-06-16: qty 5

## 2022-06-16 MED ORDER — SUGAMMADEX SODIUM 200 MG/2ML IV SOLN
INTRAVENOUS | Status: DC | PRN
  Administered 2022-06-16 (×2): 50 mg via INTRAVENOUS

## 2022-06-16 MED ORDER — LIDOCAINE 2% (20 MG/ML) 5 ML SYRINGE
INTRAMUSCULAR | Status: DC | PRN
  Administered 2022-06-16: 60 mg via INTRAVENOUS

## 2022-06-16 MED ORDER — LACTATED RINGERS IV SOLN: INTRAVENOUS | Status: DC

## 2022-06-16 MED ORDER — CIPROFLOXACIN IN D5W 400 MG/200ML IV SOLN
400.0000 mg | Freq: Two times a day (BID) | INTRAVENOUS | Status: DC
  Administered 2022-06-16: 400 mg via INTRAVENOUS
  Filled 2022-06-16: qty 200

## 2022-06-16 MED ORDER — FENTANYL CITRATE (PF) 100 MCG/2ML IJ SOLN
INTRAMUSCULAR | Status: DC | PRN
  Administered 2022-06-16: 50 ug via INTRAVENOUS

## 2022-06-16 MED ORDER — CHLORHEXIDINE GLUCONATE 0.12 % MT SOLN
15.0000 mL | Freq: Once | OROMUCOSAL | Status: AC
  Administered 2022-06-16: 15 mL via OROMUCOSAL

## 2022-06-16 MED ORDER — ROCURONIUM BROMIDE 100 MG/10ML IV SOLN
INTRAVENOUS | Status: DC | PRN
  Administered 2022-06-16: 60 mg via INTRAVENOUS

## 2022-06-16 MED ORDER — PHENYLEPHRINE 80 MCG/ML (10ML) SYRINGE FOR IV PUSH (FOR BLOOD PRESSURE SUPPORT)
PREFILLED_SYRINGE | INTRAVENOUS | Status: DC | PRN
  Administered 2022-06-16: 160 ug via INTRAVENOUS

## 2022-06-16 MED ORDER — GEMCITABINE CHEMO FOR BLADDER INSTILLATION 2000 MG
2000.0000 mg | Freq: Once | INTRAVENOUS | Status: AC
  Administered 2022-06-16: 2000 mg via INTRAVESICAL
  Filled 2022-06-16: qty 2000

## 2022-06-16 SURGICAL SUPPLY — 16 items
BAG DRN RND TRDRP ANRFLXCHMBR (UROLOGICAL SUPPLIES) ×2
BAG URINE DRAIN 2000ML AR STRL (UROLOGICAL SUPPLIES) IMPLANT
BAG URO CATCHER STRL LF (MISCELLANEOUS) ×2 IMPLANT
CATH FOLEY 2WAY SLVR  5CC 18FR (CATHETERS) ×2
CATH FOLEY 2WAY SLVR 5CC 18FR (CATHETERS) IMPLANT
CATH URETL OPEN END 6FR 70 (CATHETERS) ×2 IMPLANT
CLOTH BEACON ORANGE TIMEOUT ST (SAFETY) ×2 IMPLANT
GLOVE BIO SURGEON STRL SZ7.5 (GLOVE) ×2 IMPLANT
GOWN STRL REUS W/ TWL XL LVL3 (GOWN DISPOSABLE) ×2 IMPLANT
GOWN STRL REUS W/TWL XL LVL3 (GOWN DISPOSABLE) ×2
GUIDEWIRE STR DUAL SENSOR (WIRE) ×2 IMPLANT
LOOP CUT BIPOLAR 24F LRG (ELECTROSURGICAL) IMPLANT
MANIFOLD NEPTUNE II (INSTRUMENTS) ×2 IMPLANT
PACK CYSTO (CUSTOM PROCEDURE TRAY) ×2 IMPLANT
TUBING CONNECTING 10 (TUBING) ×2 IMPLANT
TUBING UROLOGY SET (TUBING) IMPLANT

## 2022-06-16 NOTE — Anesthesia Procedure Notes (Signed)
Procedure Name: Intubation Date/Time: 06/16/2022 12:01 PM  Performed by: Gwyndolyn Saxon, CRNAPre-anesthesia Checklist: Patient identified, Emergency Drugs available, Suction available and Patient being monitored Patient Re-evaluated:Patient Re-evaluated prior to induction Oxygen Delivery Method: Circle system utilized Preoxygenation: Pre-oxygenation with 100% oxygen Induction Type: IV induction Ventilation: Mask ventilation without difficulty Laryngoscope Size: Miller and 2 Grade View: Grade I Tube type: Oral Tube size: 7.0 mm Number of attempts: 1 Airway Equipment and Method: Patient positioned with wedge pillow and Stylet Placement Confirmation: ETT inserted through vocal cords under direct vision, positive ETCO2 and breath sounds checked- equal and bilateral Secured at: 21 cm Tube secured with: Tape Dental Injury: Teeth and Oropharynx as per pre-operative assessment

## 2022-06-16 NOTE — Transfer of Care (Signed)
Immediate Anesthesia Transfer of Care Note  Patient: Kara Reynolds  Procedure(s) Performed: TRANSURETHRAL RESECTION OF BLADDER TUMOR (TURBT) with GEMCITABINE BILATERAL RETROGRADE PYELOGRAM (Bilateral)  Patient Location: PACU  Anesthesia Type:General  Level of Consciousness: drowsy and patient cooperative  Airway & Oxygen Therapy: Patient Spontanous Breathing  Post-op Assessment: Report given to RN and Post -op Vital signs reviewed and stable  Post vital signs: Reviewed and stable  Last Vitals:  Vitals Value Taken Time  BP    Temp    Pulse    Resp    SpO2      Last Pain:  Vitals:   06/16/22 1025  TempSrc:   PainSc: 0-No pain         Complications: No notable events documented.

## 2022-06-16 NOTE — Anesthesia Postprocedure Evaluation (Signed)
Anesthesia Post Note  Patient: Kara Reynolds  Procedure(s) Performed: TRANSURETHRAL RESECTION OF BLADDER TUMOR (TURBT) with GEMCITABINE BILATERAL RETROGRADE PYELOGRAM (Bilateral)     Patient location during evaluation: PACU Anesthesia Type: General Level of consciousness: awake and alert Pain management: pain level controlled Vital Signs Assessment: post-procedure vital signs reviewed and stable Respiratory status: spontaneous breathing, nonlabored ventilation, respiratory function stable and patient connected to nasal cannula oxygen Cardiovascular status: blood pressure returned to baseline and stable Postop Assessment: no apparent nausea or vomiting Anesthetic complications: no  No notable events documented.  Last Vitals:  Vitals:   06/16/22 1415 06/16/22 1445  BP: (!) 127/48 137/88  Pulse: 77 82  Resp: 20 16  Temp: 36.7 C 36.8 C  SpO2: 93% 96%    Last Pain:  Vitals:   06/16/22 1445  TempSrc: Oral  PainSc: 0-No pain                 [name]

## 2022-06-16 NOTE — Discharge Instructions (Signed)

## 2022-06-16 NOTE — Op Note (Addendum)
Operative Note  Preoperative diagnosis:  1.  Bladder tumor  Postoperative diagnosis: 1.  Bladder tumor--medium  Procedure(s): 1.  Cystoscopy with bilateral retrograde pyelogram, left ureteral catheterization 2.  Transurethral resection of bladder tumor--medium 3. Intravesical installation of gemcitabine  Surgeon: Link Snuffer, MD  Assistants: None  Anesthesia: General  Complications: None immediate  EBL: Minimal  Specimens: 1.  Bladder tumor  Drains/Catheters: 1.  Foley catheter  Intraoperative findings: 1.  Normal urethra 2.  Bilateral retrograde pyelogram without any filling defect or hydronephrosis. 3.  Approximately 3 cm area of very superficial low-grade appearing bladder tumor.  This was resected and/or fulgurated.  Some of the abnormal area was overlying the left ureteral orifice.  This was intubated with an open-ended ureteral catheter during resection.  Ureteral orifice was open and effluxed well after treatment of the bladder tumor  Indication: 77 year old female with a history of bladder cancer found to have a recurrence presents for the previously mentioned operation.  Description of procedure:  The patient was identified and consent was obtained.  The patient was taken to the operating room and placed in the supine position.  The patient was placed under general anesthesia.  Perioperative antibiotics were administered.  The patient was placed in dorsal lithotomy.  Patient was prepped and draped in a standard sterile fashion and a timeout was performed.  A 21 French rigid cystoscope was advanced into the urethra and into the bladder.  Complete cystoscopy was performed with findings noted above.  The right ureter was cannulated with an open-ended ureteral catheter and a retrograde pyelogram was performed with no abnormal findings.  Same was performed on the left again with no abnormal findings.  Given the tumor overlying the ureteral orifice on the edges, I advanced  the open-ended ureteral catheter up the ureter to the mid ureter.  I withdrew the scope keeping the open-ended ureteral catheter in place.  I advanced a 51 French resectoscope with a visual obturator and placed into the urethra and into the bladder.  I exchanged for a bipolar working element.  I resected and/or fulgurated all the superficial tumor.  Also treated the bladder tumor overlying the ureteral orifice and was able to fulgurate all of the tumor while the open-ended ureteral catheter was in place.  I withdrew the open-ended ureteral catheter.  There was a small abnormality on the posterior portion of the ureteral orifice that was carefully fulgurated.  The left ureteral orifice remained wide open and E flux clear.  All abnormal areas were treated.  I withdrew the scope and placed a Foley catheter.  This include the operation.  Patient tolerated the procedure well and was stable postoperatively.  In the PACU, intravesical gemcitabine was instilled.  This remained for approximate 1 hour prior to proper disposal.  Plan: Follow-up in 1 week for pathology review.

## 2022-06-16 NOTE — H&P (Signed)
CC/HPI: CC: History of bladder cancer, frequency, urgency  HPI:  06/07/2021  77 year old female who is the wife of a patient of mine. We had previously discussed her history during one of his appointments and she comes in today to establish care. She has a history of low-grade superficial bladder cancer that was resected on 01/13/2019. Her last cystoscopy was about 6 months ago. She has not had a recurrence. Her urologist retired and she wants to establish care here and is due for a cystoscopy. She also complains about daytime urinary frequency and urgency. She denies urinary incontinence. This has been occurring for a few months. She would like to try Myrbetriq.   12/03/2021  Patient presents today for surveillance cystoscopy. Myrbetriq gave her some nausea. Still has some intermittent problems with frequency/urgency. Does not want any further intervention right now.   05/28/2022  Patient presents today for surveillance cystoscopy.     ALLERGIES: Simvastatin - ??    MEDICATIONS: Aspirin 81 mg tablet,chewable  Metoprolol Tartrate 25 mg tablet  Bentyl  Protonix 40 mg tablet, delayed release  Xanax 0.5 mg tablet     GU PSH: Cystoscopy - 12/03/2021       PSH Notes: cholecystectomy with gangrene 2013, bladder surgery 2020   NON-GU PSH: Appendectomy (open) Cholecystectomy (open) Remove Tonsils     GU PMH: Bladder Cancer overlapping sites - 12/03/2021, - 06/07/2021 Urinary Frequency - 06/07/2021 Urinary Urgency - 06/07/2021 History of bladder cancer    NON-GU PMH: Anxiety GERD    FAMILY HISTORY: 2 daughters - Other Heart Disease - Mother prostate cancer in father - Father    Notes: 2 daughters   SOCIAL HISTORY: Marital Status: Married Preferred Language: English Current Smoking Status: Patient does not smoke anymore.   Tobacco Use Assessment Completed: Used Tobacco in last 30 days? Has never drank.  Does not drink caffeine. Patient's occupation is/was retired.    REVIEW OF  SYSTEMS:    GU Review Female:   Patient denies frequent urination, hard to postpone urination, burning /pain with urination, get up at night to urinate, leakage of urine, stream starts and stops, trouble starting your stream, have to strain to urinate, and being pregnant.  Gastrointestinal (Upper):   Patient denies indigestion/ heartburn, nausea, and vomiting.  Gastrointestinal (Lower):   Patient denies diarrhea and constipation.  Constitutional:   Patient denies fever, night sweats, weight loss, and fatigue.  Skin:   Patient denies skin rash/ lesion and itching.  Eyes:   Patient denies blurred vision and double vision.  Ears/ Nose/ Throat:   Patient denies sore throat and sinus problems.  Hematologic/Lymphatic:   Patient denies swollen glands and easy bruising.  Cardiovascular:   Patient denies leg swelling and chest pains.  Respiratory:   Patient denies cough and shortness of breath.  Endocrine:   Patient denies excessive thirst.  Musculoskeletal:   Patient denies back pain and joint pain.  Neurological:   Patient denies headaches and dizziness.  Psychologic:   Patient denies depression and anxiety.   VITAL SIGNS: None   GU PHYSICAL EXAMINATION:    Vagina: Moderate vaginal atrophy. No stenosis. No rectocele. No cystocele. No enterocele.    MULTI-SYSTEM PHYSICAL EXAMINATION:    Constitutional: Well-nourished. No physical deformities. Normally developed. Good grooming.  Respiratory: No labored breathing, no use of accessory muscles.   Gastrointestinal: No mass, no tenderness, no rigidity, non obese abdomen.     Complexity of Data:  Source Of History:  Patient  Records Review:   Previous  Doctor Records, Previous Patient Records   PROCEDURES:         Flexible Cystoscopy - 52000  Risks, benefits, and the potential complications of the procedure were discussed with the patient including infection, bleeding, voiding discomfort, urinary retention, etc. All questions were answered. Consent  was obtained. Sterile technique and intraurethral analgesia were used.  Meatus:  Normal size. Normal location. Normal condition.  Urethra:  Normal urethra  Ureteral Orifices:  Normal location. Normal size. Normal shape. Slightly papillary mucosa overlying the left ureteral orifice.  Bladder:  No trabeculation. Superficial appearing bladder tumor with slightly raised mucosa appears like low-grade urothelial cell. This is on the left posterior lateral wall just lateral to the left ureteral orifice. There were some papillary areas overlying the left ureteral orifice as well. Total area about 3 cm no stones.      The procedure was well-tolerated and without complications. Instructions were given to call the office if she developed any problems. The patient stated that she understood these instructions.         Urinalysis w/Scope Dipstick Dipstick Cont'd Micro  Color: Yellow Bilirubin: Neg mg/dL WBC/hpf: 0 - 5/hpf  Appearance: Clear Ketones: Neg mg/dL RBC/hpf: 0 - 2/hpf  Specific Gravity: 1.020 Blood: Trace ery/uL Bacteria: NS (Not Seen)  pH: <=5.0 Protein: Neg mg/dL Cystals: NS (Not Seen)  Glucose: Neg mg/dL Urobilinogen: 0.2 mg/dL Casts: NS (Not Seen)    Nitrites: Neg Trichomonas: Not Present    Leukocyte Esterase: Neg leu/uL Mucous: Present      Epithelial Cells: 0 - 5/hpf      Yeast: NS (Not Seen)      Sperm: Not Present    ASSESSMENT:      ICD-10 Details  1 GU:   Bladder Cancer overlapping sites - C67.8 Chronic, Stable   PLAN:           Orders Labs Urine Culture          Document Letter(s):  Created for Patient: Clinical Summary         Notes:   Plan for transurethral resection of bladder tumor with bilateral retrograde pyelogram, likely left ureteral stent given that the likely need to resect over the left ureteral orifice, intravesical gemcitabine.   CC: Dr. Bea Graff    Signed by Link Snuffer, III, M.D. on 05/28/22 at 2:21 PM (EST

## 2022-06-16 NOTE — Anesthesia Preprocedure Evaluation (Addendum)
Anesthesia Evaluation  Patient identified by MRN, date of birth, ID band Patient awake    Reviewed: Allergy & Precautions, NPO status , Patient's Chart, lab work & pertinent test results, reviewed documented beta blocker date and time   Airway Mallampati: I  TM Distance: >3 FB Neck ROM: Full    Dental  (+) Partial Upper, Dental Advisory Given   Pulmonary COPD   Pulmonary exam normal breath sounds clear to auscultation       Cardiovascular Normal cardiovascular exam+ dysrhythmias + Valvular Problems/Murmurs (mild MR) MR  Rhythm:Regular Rate:Normal  TTE 2023 No significant change when compared to previous study 10/24/2020.  The left ventricular size is normal.  There is normal left ventricular wall thickness.  LV ejection fraction = 60-65%.  Left ventricular systolic function is normal.  There is mild mitral regurgitation.  There is mild tricuspid regurgitation.  IVC size was mildly dilated.     Neuro/Psych  PSYCHIATRIC DISORDERS Anxiety     negative neurological ROS     GI/Hepatic Neg liver ROS,GERD  Medicated,,  Endo/Other  negative endocrine ROS    Renal/GU negative Renal ROS  negative genitourinary   Musculoskeletal negative musculoskeletal ROS (+)    Abdominal   Peds  Hematology negative hematology ROS (+)   Anesthesia Other Findings   Reproductive/Obstetrics                             Anesthesia Physical Anesthesia Plan  ASA: 2  Anesthesia Plan: General   Post-op Pain Management: Tylenol PO (pre-op)*   Induction: Intravenous  PONV Risk Score and Plan: 3 and Ondansetron, Dexamethasone and Treatment may vary due to age or medical condition  Airway Management Planned: Oral ETT  Additional Equipment:   Intra-op Plan:   Post-operative Plan: Extubation in OR  Informed Consent: I have reviewed the patients History and Physical, chart, labs and discussed the procedure  including the risks, benefits and alternatives for the proposed anesthesia with the patient or authorized representative who has indicated his/her understanding and acceptance.     Dental advisory given  Plan Discussed with: CRNA  Anesthesia Plan Comments:        Anesthesia Quick Evaluation

## 2022-06-17 ENCOUNTER — Encounter (HOSPITAL_COMMUNITY): Payer: Self-pay | Admitting: Urology

## 2022-06-17 LAB — SURGICAL PATHOLOGY

## 2022-06-18 ENCOUNTER — Emergency Department (HOSPITAL_COMMUNITY): Payer: Medicare Other

## 2022-06-18 ENCOUNTER — Emergency Department (HOSPITAL_COMMUNITY)
Admission: EM | Admit: 2022-06-18 | Discharge: 2022-06-18 | Disposition: A | Discharge status: Home / Self Care | Payer: Medicare Other | Source: Home / Self Care | Attending: Emergency Medicine | Admitting: Emergency Medicine

## 2022-06-18 DIAGNOSIS — E876 Hypokalemia: Secondary | ICD-10-CM | POA: Diagnosis not present

## 2022-06-18 DIAGNOSIS — E872 Acidosis, unspecified: Secondary | ICD-10-CM | POA: Diagnosis not present

## 2022-06-18 DIAGNOSIS — N750 Cyst of Bartholin's gland: Secondary | ICD-10-CM | POA: Insufficient documentation

## 2022-06-18 DIAGNOSIS — Z9049 Acquired absence of other specified parts of digestive tract: Secondary | ICD-10-CM | POA: Diagnosis not present

## 2022-06-18 DIAGNOSIS — Z9221 Personal history of antineoplastic chemotherapy: Secondary | ICD-10-CM | POA: Diagnosis not present

## 2022-06-18 DIAGNOSIS — I700 Atherosclerosis of aorta: Secondary | ICD-10-CM | POA: Insufficient documentation

## 2022-06-18 DIAGNOSIS — K5730 Diverticulosis of large intestine without perforation or abscess without bleeding: Secondary | ICD-10-CM | POA: Insufficient documentation

## 2022-06-18 DIAGNOSIS — D72819 Decreased white blood cell count, unspecified: Secondary | ICD-10-CM | POA: Diagnosis not present

## 2022-06-18 DIAGNOSIS — J438 Other emphysema: Secondary | ICD-10-CM | POA: Diagnosis not present

## 2022-06-18 DIAGNOSIS — K529 Noninfective gastroenteritis and colitis, unspecified: Secondary | ICD-10-CM | POA: Insufficient documentation

## 2022-06-18 DIAGNOSIS — F419 Anxiety disorder, unspecified: Secondary | ICD-10-CM | POA: Insufficient documentation

## 2022-06-18 DIAGNOSIS — Z960 Presence of urogenital implants: Secondary | ICD-10-CM | POA: Diagnosis not present

## 2022-06-18 DIAGNOSIS — K219 Gastro-esophageal reflux disease without esophagitis: Secondary | ICD-10-CM | POA: Diagnosis not present

## 2022-06-18 DIAGNOSIS — Z881 Allergy status to other antibiotic agents status: Secondary | ICD-10-CM | POA: Diagnosis not present

## 2022-06-18 DIAGNOSIS — R319 Hematuria, unspecified: Secondary | ICD-10-CM | POA: Diagnosis not present

## 2022-06-18 DIAGNOSIS — M810 Age-related osteoporosis without current pathological fracture: Secondary | ICD-10-CM | POA: Diagnosis not present

## 2022-06-18 DIAGNOSIS — E559 Vitamin D deficiency, unspecified: Secondary | ICD-10-CM | POA: Diagnosis not present

## 2022-06-18 DIAGNOSIS — E7800 Pure hypercholesterolemia, unspecified: Secondary | ICD-10-CM | POA: Insufficient documentation

## 2022-06-18 DIAGNOSIS — Z79899 Other long term (current) drug therapy: Secondary | ICD-10-CM | POA: Diagnosis not present

## 2022-06-18 DIAGNOSIS — C679 Malignant neoplasm of bladder, unspecified: Secondary | ICD-10-CM | POA: Diagnosis not present

## 2022-06-18 DIAGNOSIS — I4719 Other supraventricular tachycardia: Secondary | ICD-10-CM | POA: Insufficient documentation

## 2022-06-18 DIAGNOSIS — K449 Diaphragmatic hernia without obstruction or gangrene: Secondary | ICD-10-CM | POA: Diagnosis not present

## 2022-06-18 LAB — CBC WITH DIFFERENTIAL/PLATELET
Abs Immature Granulocytes: 0.02 10*3/uL (ref 0.00–0.07)
Basophils Absolute: 0 10*3/uL (ref 0.0–0.1)
Basophils Relative: 0 %
Eosinophils Absolute: 0 10*3/uL (ref 0.0–0.5)
Eosinophils Relative: 0 %
HCT: 39.8 % (ref 36.0–46.0)
Hemoglobin: 13.3 g/dL (ref 12.0–15.0)
Immature Granulocytes: 0 %
Lymphocytes Relative: 10 %
Lymphs Abs: 0.6 10*3/uL — ABNORMAL LOW (ref 0.7–4.0)
MCH: 31.2 pg (ref 26.0–34.0)
MCHC: 33.4 g/dL (ref 30.0–36.0)
MCV: 93.4 fL (ref 80.0–100.0)
Monocytes Absolute: 0.2 10*3/uL (ref 0.1–1.0)
Monocytes Relative: 3 %
Neutro Abs: 5.2 10*3/uL (ref 1.7–7.7)
Neutrophils Relative %: 87 %
Platelets: 215 10*3/uL (ref 150–400)
RBC: 4.26 MIL/uL (ref 3.87–5.11)
RDW: 12.8 % (ref 11.5–15.5)
WBC: 6 10*3/uL (ref 4.0–10.5)
nRBC: 0 % (ref 0.0–0.2)

## 2022-06-18 LAB — COMPREHENSIVE METABOLIC PANEL
ALT: 48 U/L — ABNORMAL HIGH (ref 0–44)
AST: 49 U/L — ABNORMAL HIGH (ref 15–41)
Albumin: 4 g/dL (ref 3.5–5.0)
Alkaline Phosphatase: 44 U/L (ref 38–126)
Anion gap: 9 (ref 5–15)
BUN: 18 mg/dL (ref 8–23)
CO2: 24 mmol/L (ref 22–32)
Calcium: 8.3 mg/dL — ABNORMAL LOW (ref 8.9–10.3)
Chloride: 103 mmol/L (ref 98–111)
Creatinine, Ser: 0.69 mg/dL (ref 0.44–1.00)
GFR, Estimated: >60 mL/min (ref >60)
Glucose, Bld: 101 mg/dL — ABNORMAL HIGH (ref 70–99)
Potassium: 3.5 mmol/L (ref 3.5–5.1)
Sodium: 136 mmol/L (ref 135–145)
Total Bilirubin: 0.8 mg/dL (ref 0.3–1.2)
Total Protein: 7.6 g/dL (ref 6.5–8.1)

## 2022-06-18 LAB — C DIFFICILE QUICK SCREEN W PCR REFLEX
C Diff antigen: NEGATIVE
C Diff interpretation: NOT DETECTED
C Diff toxin: NEGATIVE

## 2022-06-18 LAB — URINALYSIS, ROUTINE W REFLEX MICROSCOPIC
Bilirubin Urine: NEGATIVE
Glucose, UA: NEGATIVE mg/dL
Ketones, ur: 5 mg/dL — AB
Nitrite: NEGATIVE
Protein, ur: 100 mg/dL — AB
Specific Gravity, Urine: 1.021 (ref 1.005–1.030)
pH: 5 (ref 5.0–8.0)

## 2022-06-18 LAB — LIPASE, BLOOD: Lipase: 43 U/L (ref 11–51)

## 2022-06-18 LAB — LACTIC ACID, PLASMA: Lactic Acid, Venous: 0.9 mmol/L (ref 0.5–1.9)

## 2022-06-18 MED ORDER — ONDANSETRON HCL 4 MG/2ML IJ SOLN
4.0000 mg | Freq: Once | INTRAMUSCULAR | Status: AC
  Administered 2022-06-18: 4 mg via INTRAVENOUS
  Filled 2022-06-18: qty 2

## 2022-06-18 MED ORDER — IOHEXOL 300 MG/ML  SOLN
75.0000 mL | Freq: Once | INTRAMUSCULAR | Status: AC | PRN
  Administered 2022-06-18: 75 mL via INTRAVENOUS

## 2022-06-18 MED ORDER — METRONIDAZOLE 500 MG/100ML IV SOLN
500.0000 mg | Freq: Once | INTRAVENOUS | Status: AC
  Administered 2022-06-18: 500 mg via INTRAVENOUS
  Filled 2022-06-18: qty 100

## 2022-06-18 MED ORDER — SODIUM CHLORIDE 0.9 % IV BOLUS
1000.0000 mL | Freq: Once | INTRAVENOUS | Status: AC
  Administered 2022-06-18: 1000 mL via INTRAVENOUS

## 2022-06-18 MED ORDER — IOHEXOL 300 MG/ML  SOLN
100.0000 mL | Freq: Once | INTRAMUSCULAR | Status: AC | PRN
  Administered 2022-06-18: 100 mL via INTRAVENOUS

## 2022-06-18 MED ORDER — SODIUM CHLORIDE 0.9 % IV SOLN
2.0000 g | Freq: Once | INTRAVENOUS | Status: AC
  Administered 2022-06-18: 2 g via INTRAVENOUS
  Filled 2022-06-18: qty 12.5

## 2022-06-18 MED ORDER — LOPERAMIDE HCL 2 MG PO CAPS
4.0000 mg | ORAL_CAPSULE | Freq: Once | ORAL | Status: AC
  Administered 2022-06-18: 4 mg via ORAL
  Filled 2022-06-18: qty 2

## 2022-06-18 MED ORDER — METRONIDAZOLE 500 MG PO TABS: 500.0000 mg | ORAL_TABLET | Freq: Two times a day (BID) | ORAL | 0 refills | Status: DC

## 2022-06-18 MED ORDER — SODIUM CHLORIDE 0.9 % IV SOLN
2.0000 g | Freq: Once | INTRAVENOUS | Status: AC
  Administered 2022-06-18: 2 g via INTRAVENOUS
  Filled 2022-06-18: qty 20

## 2022-06-18 MED ORDER — LOPERAMIDE HCL 2 MG PO CAPS: 2.0000 mg | ORAL_CAPSULE | Freq: Four times a day (QID) | ORAL | 0 refills | Status: DC | PRN

## 2022-06-18 MED ORDER — SODIUM CHLORIDE 0.9 % IV SOLN: INTRAVENOUS | Status: DC

## 2022-06-18 MED ORDER — ONDANSETRON 8 MG PO TBDP: 8.0000 mg | ORAL_TABLET | Freq: Three times a day (TID) | ORAL | 0 refills | Status: DC | PRN

## 2022-06-18 MED ORDER — CEFUROXIME AXETIL 500 MG PO TABS: 500.0000 mg | ORAL_TABLET | Freq: Two times a day (BID) | ORAL | 0 refills | Status: DC

## 2022-06-18 NOTE — ED Provider Notes (Signed)
[place] AT [facility] Provider Note   CSN: [id number] Arrival date & time: 06/18/22  1346     History  Chief Complaint  Patient presents with   Nausea   Emesis   Diarrhea    Kara Reynolds is a 77 y.o. female.   Emesis Associated symptoms: diarrhea   Diarrhea Associated symptoms: vomiting      Patient has history of anxiety, hypercholesterolemia, mitral valve disorder, paroxysmal atrial tachycardia and recent diagnosis of bladder cancer.  Patient underwent a transurethral resection of a bladder tumor on February 5 by Dr. Gloriann Loan.  Patient states initially she was doing okay after discharge however yesterday she began having trouble with vomiting as well as some loose stools.  She is having pain in her abdomen.  Patient states she feels very ill.  She has no appetite at all.  She last vomited earlier this afternoon.  She feels weak and dehydrated.  She denies any fevers.  She has noticed some blood in her urine following her procedure.  Home Medications Prior to Admission medications   Medication Sig Start Date End Date Taking? Authorizing Provider  cefUROXime (CEFTIN) 500 MG tablet Take 1 tablet (500 mg total) by mouth 2 (two) times daily with a meal for 7 days. 06/18/22 06/25/22 Yes Dorie Rank, MD  loperamide (IMODIUM) 2 MG capsule Take 1 capsule (2 mg total) by mouth 4 (four) times daily as needed for diarrhea or loose stools. 06/18/22  Yes Dorie Rank, MD  metroNIDAZOLE (FLAGYL) 500 MG tablet Take 1 tablet (500 mg total) by mouth 2 (two) times daily. 06/18/22  Yes Dorie Rank, MD  ondansetron (ZOFRAN-ODT) 8 MG disintegrating tablet Take 1 tablet (8 mg total) by mouth every 8 (eight) hours as needed for nausea or vomiting. 06/18/22  Yes Dorie Rank, MD  ALPRAZolam Duanne Moron) 0.5 MG tablet Take 0.5 mg by mouth at bedtime.    [provider]  dicyclomine (BENTYL) 10 MG capsule Take 1 capsule (10 mg total) by mouth 2 (two) times daily. Patient taking  differently: Take 10 mg by mouth 2 (two) times daily as needed for spasms. 02/23/18   Jackquline Denmark, MD  metoprolol tartrate (LOPRESSOR) 25 MG tablet Take 2 tablets (50 mg total) daily as needed by mouth. Patient taking differently: Take 25 mg by mouth 2 (two) times daily as needed (palpitations). 03/18/17   Richardo Priest, MD  pantoprazole (PROTONIX) 40 MG tablet TAKE 1 TABLET BY MOUTH EVERY DAY 06/13/19   Jackquline Denmark, MD  potassium chloride SA (KLOR-CON) 20 MEQ tablet Take 1 tablet (20 mEq total) by mouth daily for 7 days. Patient not taking: Reported on 06/04/2022 02/11/19 02/18/19  Jackquline Denmark, MD  promethazine (PHENERGAN) 25 MG tablet Take 1 tablet (25 mg total) by mouth every 8 (eight) hours as needed for nausea or vomiting. Patient not taking: Reported on 06/04/2022 02/09/19   Jackquline Denmark, MD  traMADol (ULTRAM) 50 MG tablet Take 1 tablet (50 mg total) by mouth every 6 (six) hours as needed. 06/16/22 06/16/23  Lucas Mallow, MD      Allergies    Amoxicillin-pot clavulanate    Review of Systems   Review of Systems  Gastrointestinal:  Positive for diarrhea and vomiting.    Physical Exam Updated Vital Signs BP 137/62   Pulse 93   Temp 100.2 F (37.9 C)   Resp 17   SpO2 96%  Physical Exam Vitals and nursing note reviewed.  Constitutional:  General: She is not in acute distress.    Appearance: She is well-developed.  HENT:     Head: Normocephalic and atraumatic.     Right Ear: External ear normal.     Left Ear: External ear normal.  Eyes:     General: No scleral icterus.       Right eye: No discharge.        Left eye: No discharge.     Conjunctiva/sclera: Conjunctivae normal.  Neck:     Trachea: No tracheal deviation.  Cardiovascular:     Rate and Rhythm: Normal rate and regular rhythm.  Pulmonary:     Effort: Pulmonary effort is normal. No respiratory distress.     Breath sounds: Normal breath sounds. No stridor. No wheezing or rales.  Abdominal:     General:  Bowel sounds are normal. There is no distension.     Palpations: Abdomen is soft.     Tenderness: There is abdominal tenderness in the right lower quadrant. There is no guarding or rebound.  Musculoskeletal:        General: No tenderness or deformity.     Cervical back: Neck supple.  Skin:    General: Skin is warm and dry.     Findings: No rash.  Neurological:     General: No focal deficit present.     Mental Status: She is alert.     Cranial Nerves: No cranial nerve deficit, dysarthria or facial asymmetry.     Sensory: No sensory deficit.     Motor: No abnormal muscle tone or seizure activity.     Coordination: Coordination normal.  Psychiatric:        Mood and Affect: Mood normal.     ED Results / Procedures / Treatments   Labs (all labs ordered are listed, but only abnormal results are displayed) Labs Reviewed  COMPREHENSIVE METABOLIC PANEL - Abnormal; Notable for the following components:      Result Value   Glucose, Bld 101 (*)    Calcium 8.3 (*)    AST 49 (*)    ALT 48 (*)    All other components within normal limits  CBC WITH DIFFERENTIAL/PLATELET - Abnormal; Notable for the following components:   Lymphs Abs 0.6 (*)    All other components within normal limits  URINALYSIS, ROUTINE W REFLEX MICROSCOPIC - Abnormal; Notable for the following components:   APPearance HAZY (*)    Hgb urine dipstick MODERATE (*)    Ketones, ur 5 (*)    Protein, ur 100 (*)    Leukocytes,Ua SMALL (*)    Bacteria, UA RARE (*)    All other components within normal limits  CULTURE, BLOOD (ROUTINE X 2)  CULTURE, BLOOD (ROUTINE X 2)  C DIFFICILE QUICK SCREEN W PCR REFLEX    URINE CULTURE  LIPASE, BLOOD  LACTIC ACID, PLASMA  LACTIC ACID, PLASMA    EKG None  Radiology CT CYSTOGRAM PELVIS  Result Date: 06/18/2022 CLINICAL DATA:  Bladder injury, bladder surgery 2 days prior to imaging EXAM: CT CYSTOGRAM (CT PELVIS WITH CONTRAST) TECHNIQUE: Multidetector CT imaging through the pelvis  was performed after dilute contrast had been introduced into the bladder for the purposes of performing CT cystography. RADIATION DOSE REDUCTION: This exam was performed according to the departmental dose-optimization program which includes automated exposure control, adjustment of the mA and/or kV according to patient size and/or use of iterative reconstruction technique. CONTRAST:  89m OMNIPAQUE IOHEXOL 300 MG/ML  SOLN COMPARISON:  CT abdomen 06/18/2022  FINDINGS: Urinary Tract: Foley catheter observed in the urinary bladder which is moderately distended with Along the left side of the urinary bladder, just anterior to the UVJ, there is a mildly irregular focal outpouching of contrast adjacent to the small locules of gas in this vicinity, as shown on image 54 series 4. This is focal and irregular although somewhat contained without a large leak. The contrast extends about 8 mm lateral to the expected margin of the urinary bladder on image 54 series 4. 4 mm of localized mild bladder wall thickening along the anterior margin of this process for example on image 32 series 2, which could be from inflammation or residual tumor. Ureters unremarkable. Bowel:  Sigmoid colon diverticulosis. Vascular/Lymphatic: Atherosclerosis is present, including aortoiliac atherosclerotic disease. No pathologic adenopathy. Reproductive:  Unremarkable Other:  No supplemental non-categorized findings. Musculoskeletal: Unremarkable IMPRESSION: 1. There is a small contained mildly irregular 8 mm focal outpouching of contrast along the left side of the urinary bladder just anterior to the left UVJ. The lack of substantial spread in the intraperitoneal or extraperitoneal space raises suspicion that this is contained or partially healed. Adjacent small amount of extraperitoneal gas in the soft tissues lateral to the left side of the urinary bladder. 2. Mild localized bladder wall thickening along the anterior margin of this process which could  be from inflammation or residual tumor. 3. Foley catheter in the urinary bladder. 4. Sigmoid colon diverticulosis. 5. Aortoiliac atherosclerotic disease. 6. Aortic atherosclerosis. Aortic Atherosclerosis (ICD10-I70.0). Electronically Signed   By: Van Clines M.D.   On: 06/18/2022 20:40   CT ABDOMEN PELVIS W CONTRAST  Result Date: 06/18/2022 CLINICAL DATA:  A 77 year old female post bladder surgery presents with RIGHT lower quadrant abdominal pain. History of bladder neoplasm. * Tracking Code: BO * EXAM: CT ABDOMEN AND PELVIS WITH CONTRAST TECHNIQUE: Multidetector CT imaging of the abdomen and pelvis was performed using the standard protocol following bolus administration of intravenous contrast. RADIATION DOSE REDUCTION: This exam was performed according to the departmental dose-optimization program which includes automated exposure control, adjustment of the mA and/or kV according to patient size and/or use of iterative reconstruction technique. CONTRAST:  177m OMNIPAQUE IOHEXOL 300 MG/ML  SOLN COMPARISON:  February 17, 2019 FINDINGS: Lower chest: Basilar atelectasis. No effusion. No consolidative changes. No chest wall abnormality to the extent evaluated. Hepatobiliary: Smooth hepatic contours. Query mild hepatic steatosis. Post cholecystectomy without biliary duct distension. No focal, suspicious hepatic lesion. Portal vein is patent. Pancreas: Normal, without mass, inflammation or ductal dilatation. Spleen: Normal. Adrenals/Urinary Tract: Adrenal glands are normal. Symmetric renal enhancement without hydronephrosis or perinephric stranding. No suspicious renal lesion. Thickening of the urinary bladder wall. Small amounts of gas in the urinary bladder. Small amounts of gas adjacent to the urinary bladder particularly on the LEFT. No definitive signs of focal fluid collection but hypoattenuation along the LEFT urinary bladder wall adjacent to small locules of air with a potential small fluid collection  measuring 12 by 18 mm. Stomach/Bowel: Stomach without signs of adjacent stranding. No sign of small bowel obstruction. Nondilated small bowel as liquid filled beyond the distal jejunum. Irregularity of the terminal ileum with mural stratification. Liquid stool in the colon. Query mild thickening of the sigmoid superimposed on diverticular changes present in this location. Vascular/Lymphatic: Aortic atherosclerosis. No sign of aneurysm. Smooth contour of the IVC. There is no gastrohepatic or hepatoduodenal ligament lymphadenopathy. No retroperitoneal or mesenteric lymphadenopathy. No pelvic sidewall lymphadenopathy. 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 No narrowing of the LEFT renal vein as it passes beneath the SMV. Reproductive structures are otherwise unremarkable. Other: Gas in the pelvis appears confined to extraperitoneal spaces. No free air in the peritoneal cavity. No signs of ascites. Near the vaginal introitus is dense area, uncertain if this is enhancing measuring 14 mm (image 53/4) Musculoskeletal: No acute musculoskeletal process. Spinal degenerative changes and osteopenia. No destructive bone findings. IMPRESSION: 1. In light of recent surgery, TURBT in the urinary bladder, the presence of extraluminal gas and non enhancement of the bladder wall raising the question of subtle bladder injury/perforation along the LEFT posterolateral urinary bladder. No current signs of ascites. Findings are favored to be extraperitoneal though more extensive injury is difficult to exclude in the absence of a cystogram or similar evaluation with urinary bladder distension. Suggest urologic consultation with CT cystogram after consultation for further evaluation. 2. No pneumoperitoneum or ascites currently to indicate extra peritoneal injury though again lowered sensitivity on standard CT for this type of injury. 3. Long segment  thickening of the colon raising the question of colitis with signs of potential enteritis. Findings are nonspecific and may be infectious in nature. Correlate with any signs of infectious colitis such as C difficile. Findings in the colon most pronounced in the sigmoid and are superimposed on extensive diverticular changes and are nonspecific. 4. Ovoid area near the vaginal introitus may represent a hemorrhagic or proteinaceous Bartholin's gland cyst and is present on the RIGHT. Correlate with physical exam. Findings are unchanged since previous imaging of October of 2020. 5. 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 Findings can be seen in the setting of pelvic congestion syndrome. 6. Aortic atherosclerosis. Aortic Atherosclerosis (ICD10-I70.0). These results were called by telephone at the time of interpretation on 06/18/2022 at 6:09 pm to provider [facility] , who verbally acknowledged these results. Electronically Signed   By: Zetta Bills M.D.   On: 06/18/2022 18:12    Procedures Procedures    Medications Ordered in ED Medications  sodium chloride 0.9 % bolus 1,000 mL (0 mLs Intravenous Stopped 06/18/22 1708)    And  0.9 %  sodium chloride infusion (0 mLs Intravenous Hold 06/18/22 1604)  loperamide (IMODIUM) capsule 4 mg (has no administration in time range)  ondansetron (ZOFRAN) injection 4 mg (4 mg Intravenous Given 06/18/22 1546)  iohexol (OMNIPAQUE) 300 MG/ML solution 100 mL (100 mLs Intravenous Contrast Given 06/18/22 1729)  cefTRIAXone (ROCEPHIN) 2 g in sodium chloride 0.9 % 100 mL IVPB (0 g Intravenous Stopped 06/18/22 1833)  ceFEPIme (MAXIPIME) 2 g in sodium chloride 0.9 % 100 mL IVPB (0 g Intravenous Stopped 06/18/22 1903)    And  metroNIDAZOLE (FLAGYL) IVPB 500 mg (0 mg Intravenous Stopped 06/18/22 2025)  iohexol (OMNIPAQUE) 300 MG/ML solution 75 mL (75 mLs Intravenous Contrast Given 06/18/22 2001)    ED Course/ Medical  Decision Making/ A&P Clinical Course as of 06/18/22 2202  Wed Jun 18, 2022  1635 Comprehensive metabolic panel(!) Metabolic panel without send it again abnormalities [JK]  1635 CBC with Diff(!) CBC normal [JK]  1635 Lipase, blood Normal [JK]  1635 Urinalysis, Routine w reflex microscopic -Urine, Clean Catch(!) Urinalysis does show red blood cells and white blood cells.  Rare bacteria [JK]  1728 Patient has a temperature to 101 now [JK]  1730 Blood cultures lactic acid level and IV antibiotics ordered [JK]  1816 Patient CT scan  shows evidence of extraluminal gas and nonenhancement of the bladder wall raising the question of bladder injury.  Patient has some evidence of long segment thickening of the colon raising the question of colitis and enteritis.  Incidental hemorrhagic or proteinaceous Bartholin's gland cyst. [JK]  1845 Case was discussed with urology.  Recommendation is to proceed with CT cystogram.  I will call back after the results [JK]  2112 CT cystogram shows small contained mildly irregular focal outpouching of contrast on the left side of the urinary bladder.  Lack of substantial spread in the intraperitoneal extraperitoneal space raises suspicion that this is contained a partially healed [JK]  2157 Patient evaluated by urology team.  Patient can DC the Foley.  No need for admission or further urologic intervention [JK]    Clinical Course User Index [JK] Dorie Rank, MD                             Medical Decision Making Problems Addressed: Colitis: acute illness or injury that poses a threat to life or bodily functions  Amount and/or Complexity of Data Reviewed Labs: ordered. Decision-making details documented in ED Course. Radiology: ordered and independent interpretation performed.  Risk Prescription drug management.   Patient presented to the ER for evaluation of abdominal pain diarrhea in the setting of recent bladder surgery.  Initial CT scan raised the concern of  possible bladder wall injury related to her surgery.  A CT cystogram was performed and there does not appear to be any signs of serious injury.  Patient was evaluated by urology and she is stable for outpatient management regarding her urologic issues.  Patient has had frequent diarrhea and the CT scan did show findings suggestive of possible mild colitis or enteritis.  Patient has not had any vomiting.  She has been able to eat and drink.  Discussed options of admission to the hospital versus outpatient management and close follow-up.  Patient states she would like to go home.  Will plan on antibiotics to treat bacterial colitis.        Final Clinical Impression(s) / ED Diagnoses Final diagnoses:  Colitis    Rx / DC Orders ED Discharge Orders          Ordered    cefUROXime (CEFTIN) 500 MG tablet  2 times daily with meals        06/18/22 2200    metroNIDAZOLE (FLAGYL) 500 MG tablet  2 times daily        06/18/22 2200    loperamide (IMODIUM) 2 MG capsule  4 times daily PRN        06/18/22 2200    ondansetron (ZOFRAN-ODT) 8 MG disintegrating tablet  Every 8 hours PRN        06/18/22 2200              Dorie Rank, MD 06/18/22 2203

## 2022-06-18 NOTE — Discharge Instructions (Signed)
Take the antibiotics as prescribed.  You can take the Imodium to help with the diarrhea.  The Zofran is for any nausea you might have.  Follow-up with your doctor to be rechecked.  Return to the ER for worsening symptoms

## 2022-06-18 NOTE — ED Notes (Signed)
Called lab to add on urine culture ?

## 2022-06-18 NOTE — ED Triage Notes (Signed)
Pt arrived via POV. Pt had bladder surgery 2x days ago. N/V/D beginning yesterday.   AOx4

## 2022-06-18 NOTE — Consult Note (Signed)
Urology Consult Note   Requesting Attending Physician:  Dorie Rank, MD Service Providing Consult: Urology  Consulting Attending: Dr. Junious Silk   Reason for Consult:  Nausea/vomiting  HPI: Kara Reynolds is seen in consultation for reasons noted above at the request of Dorie Rank, MD  for evaluation of nausea vomiting in setting of recent post op.  This is a 77 y.o. female with past medical history per below who underwent a transurethral resection of a bladder tumor on 06/16/2022 which was uneventful.  She returns today with nausea, vomiting, diarrhea.  Had some hematuria immediately postop which has since resolved.  In the emergency department had a low-grade temperature but was otherwise hemodynamically stable.  A CT was obtained which showed possible colitis as well as a questionable subtle bladder injury along the left posterolateral bladder wall.  Due to this finding, a CT cystogram was obtained which showed an outpouching of contrast along the left side of the bladder near the UVJ.  On my review, it appears consistent with expected postoperative changes.  She denies any voiding symptoms.  UA consistent with postoperative changes and largely unconcerning for infection.   Past Medical History: Past Medical History:  Diagnosis Date   Acute cystitis without hematuria 07/03/2016   Anxiety 06/29/2015   Last Assessment & Plan:  Needs refill on this does not use it daily at all , but it does help her and she would like to continue with this and never fills this consistently    Cancer [facility])    Bladder cancer   Chest discomfort 10/29/2016   Colon polyp    Encounter for screening colonoscopy 01/03/2016   GERD (gastroesophageal reflux disease) 06/29/2015   Last Assessment & Plan:  We discussed the side effects of the PPIs and use of zantac alternating if she would like, and she feels the PPI is needed for her GERD and does helps her, discussed bone loss as well as memory issues with the class as  well   Hypercholesteremia 06/29/2015   Last Assessment & Plan:  Update her fasting labs for her today and discussed diet which she and her husband are working on more with her weight as well   IBS (irritable bowel syndrome)    Malaise and fatigue 07/02/2015   Last Assessment & Plan:  Off and on for her, she tries to remain active is retired now worked at [employer] and has had blood exposure and she is going to have the hepatitis C panel drawn for this purpose   Mild mitral regurgitation    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 She isnt willing.  Ordering a new dexa.  Last Assessment & Plan:  Update her vitamin d level, she is not on meds for this and had her last exam this year in februray   Other emphysema ([place]) 07/03/2016   Palpitations 10/29/2016   Paroxysmal atrial tachycardia 10/29/2016   Postmenopausal 06/29/2015   Last Assessment & Plan:  Not using any hormones, she feels she is doing well and has her mammograms done at [place]   Vitamin D deficiency 06/29/2015   Last Assessment & Plan:  Update her level for her today and see where she is at    Past Surgical History:  Past Surgical History:  Procedure Laterality Date   APPENDECTOMY     CHOLECYSTECTOMY  CYSTOSCOPY W/ URETERAL STENT PLACEMENT Bilateral 06/16/2022   Procedure: BILATERAL RETROGRADE PYELOGRAM;  Surgeon: Lucas Mallow, MD;  Location: [facility];  Service: Urology;  Laterality: Bilateral;   left thumb surgery      TONSILLECTOMY     as a teenager   TRANSURETHRAL RESECTION OF BLADDER TUMOR  01/13/2019    Medication: Current Facility-Administered Medications  Medication Dose Route Frequency Provider Last Rate Last Admin   0.9 %  sodium chloride infusion   Intravenous Continuous Dorie Rank, MD   Held at 06/18/22 1604   Current Outpatient Medications  Medication Sig Dispense Refill    ALPRAZolam (XANAX) 0.5 MG tablet Take 0.5 mg by mouth at bedtime.     dicyclomine (BENTYL) 10 MG capsule Take 1 capsule (10 mg total) by mouth 2 (two) times daily. (Patient taking differently: Take 10 mg by mouth 2 (two) times daily as needed for spasms.) 60 capsule 6   metoprolol tartrate (LOPRESSOR) 25 MG tablet Take 2 tablets (50 mg total) daily as needed by mouth. (Patient taking differently: Take 25 mg by mouth 2 (two) times daily as needed (palpitations).) 30 tablet 1   pantoprazole (PROTONIX) 40 MG tablet TAKE 1 TABLET BY MOUTH EVERY DAY 90 tablet 3   potassium chloride SA (KLOR-CON) 20 MEQ tablet Take 1 tablet (20 mEq total) by mouth daily for 7 days. (Patient not taking: Reported on 06/04/2022) 7 tablet 0   promethazine (PHENERGAN) 25 MG tablet Take 1 tablet (25 mg total) by mouth every 8 (eight) hours as needed for nausea or vomiting. (Patient not taking: Reported on 06/04/2022) 20 tablet 0   traMADol (ULTRAM) 50 MG tablet Take 1 tablet (50 mg total) by mouth every 6 (six) hours as needed. 8 tablet 0    Allergies: Allergies  Allergen Reactions   Amoxicillin-Pot Clavulanate Nausea Only    Social History: Social History   Tobacco Use   Smoking status: Never   Smokeless tobacco: Never  Vaping Use   Vaping Use: Never used  Substance Use Topics   Alcohol use: No   Drug use: Never    Family History Family History  Problem Relation Age of Onset   Prostate cancer Father        died about 70 years ago   Stroke Maternal Grandfather    Diabetes Child    Colon cancer Neg Hx    Esophageal cancer Neg Hx     Review of Systems 10 systems were reviewed and are negative except as noted specifically in the HPI.  Objective   Vital signs in last 24 hours: BP 137/62   Pulse 93   Temp 100.2 F (37.9 C)   Resp 17   SpO2 96%   Physical Exam General: NAD, A&O, resting, appropriate HEENT: /AT, EOMI, MMM Pulmonary: Normal work of breathing Cardiovascular: HDS, adequate  peripheral perfusion Abdomen: Soft, NTTP, nondistended, . GU: Foley in place, clear yellow, no CVA tenderness Extremities: warm and well perfused Neuro: Appropriate, no focal neurological deficits  Most Recent Labs: Lab Results  Component Value Date   WBC 6.0 06/18/2022   HGB 13.3 06/18/2022   HCT 39.8 06/18/2022   PLT 215 06/18/2022    Lab Results  Component Value Date   NA 136 06/18/2022   K 3.5 06/18/2022   CL 103 06/18/2022   CO2 24 06/18/2022   BUN 18 06/18/2022   CREATININE 0.69 06/18/2022   CALCIUM 8.3 (L) 06/18/2022    No results found for: "INR", "APTT"  Urine Culture: @[id number]$ (laburin,org,[id number])@   IMAGING: CT CYSTOGRAM PELVIS  Result Date: 06/18/2022 CLINICAL DATA:  Bladder injury, bladder surgery 2 days prior to imaging EXAM: CT CYSTOGRAM (CT PELVIS WITH CONTRAST) TECHNIQUE: Multidetector CT imaging through the pelvis was performed after dilute contrast had been introduced into the bladder for the purposes of performing CT cystography. RADIATION DOSE REDUCTION: This exam was performed according to the departmental dose-optimization program which includes automated exposure control, adjustment of the mA and/or kV according to patient size and/or use of iterative reconstruction technique. CONTRAST:  15m OMNIPAQUE IOHEXOL 300 MG/ML  SOLN COMPARISON:  CT abdomen 06/18/2022 FINDINGS: Urinary Tract: Foley catheter observed in the urinary bladder which is moderately distended with Along the left side of the urinary bladder, just anterior to the UVJ, there is a mildly irregular focal outpouching of contrast adjacent to the small locules of gas in this vicinity, as shown on image 54 series 4. This is focal and irregular although somewhat contained without a large leak. The contrast extends about 8 mm lateral to the expected margin of the urinary bladder on image 54 series 4. 4 mm of localized mild bladder wall thickening along the anterior margin of this process for  example on image 32 series 2, which could be from inflammation or residual tumor. Ureters unremarkable. Bowel:  Sigmoid colon diverticulosis. Vascular/Lymphatic: Atherosclerosis is present, including aortoiliac atherosclerotic disease. No pathologic adenopathy. Reproductive:  Unremarkable Other:  No supplemental non-categorized findings. Musculoskeletal: Unremarkable IMPRESSION: 1. There is a small contained mildly irregular 8 mm focal outpouching of contrast along the left side of the urinary bladder just anterior to the left UVJ. The lack of substantial spread in the intraperitoneal or extraperitoneal space raises suspicion that this is contained or partially healed. Adjacent small amount of extraperitoneal gas in the soft tissues lateral to the left side of the urinary bladder. 2. Mild localized bladder wall thickening along the anterior margin of this process which could be from inflammation or residual tumor. 3. Foley catheter in the urinary bladder. 4. Sigmoid colon diverticulosis. 5. Aortoiliac atherosclerotic disease. 6. Aortic atherosclerosis. Aortic Atherosclerosis (ICD10-I70.0). Electronically Signed   By: WVan ClinesM.D.   On: 06/18/2022 20:40   CT ABDOMEN PELVIS W CONTRAST  Result Date: 06/18/2022 CLINICAL DATA:  A 77year old female post bladder surgery presents with RIGHT lower quadrant abdominal pain. History of bladder neoplasm. * Tracking Code: BO * EXAM: CT ABDOMEN AND PELVIS WITH CONTRAST TECHNIQUE: Multidetector CT imaging of the abdomen and pelvis was performed using the standard protocol following bolus administration of intravenous contrast. RADIATION DOSE REDUCTION: This exam was performed according to the departmental dose-optimization program which includes automated exposure control, adjustment of the mA and/or kV according to patient size and/or use of iterative reconstruction technique. CONTRAST:  10100mOMNIPAQUE IOHEXOL 300 MG/ML  SOLN COMPARISON:  February 17, 2019 FINDINGS:  Lower chest: Basilar atelectasis. No effusion. No consolidative changes. No chest wall abnormality to the extent evaluated. Hepatobiliary: Smooth hepatic contours. Query mild hepatic steatosis. Post cholecystectomy without biliary duct distension. No focal, suspicious hepatic lesion. Portal vein is patent. Pancreas: Normal, without mass, inflammation or ductal dilatation. Spleen: Normal. Adrenals/Urinary Tract: Adrenal glands are normal. Symmetric renal enhancement without hydronephrosis or perinephric stranding. No suspicious renal lesion. Thickening of the urinary bladder wall. Small amounts of gas in the urinary bladder. Small amounts of gas adjacent to the urinary bladder particularly on the LEFT. No definitive signs of focal fluid collection but hypoattenuation along the LEFT urinary bladder wall adjacent  to small locules of air with a potential small fluid collection measuring 12 by 18 mm. Stomach/Bowel: Stomach without signs of adjacent stranding. No sign of small bowel obstruction. Nondilated small bowel as liquid filled beyond the distal jejunum. Irregularity of the terminal ileum with mural stratification. Liquid stool in the colon. Query mild thickening of the sigmoid superimposed on diverticular changes present in this location. Vascular/Lymphatic: Aortic atherosclerosis. No sign of aneurysm. Smooth contour of the IVC. There is no gastrohepatic or hepatoduodenal ligament lymphadenopathy. No retroperitoneal or mesenteric lymphadenopathy. No pelvic sidewall lymphadenopathy. 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 No narrowing of the LEFT renal vein as it passes beneath the SMV. Reproductive structures are otherwise unremarkable. Other: Gas in the pelvis appears confined to extraperitoneal spaces. No free air in the peritoneal cavity. No signs of ascites. Near the vaginal introitus is dense area, uncertain if this is  enhancing measuring 14 mm (image 53/4) Musculoskeletal: No acute musculoskeletal process. Spinal degenerative changes and osteopenia. No destructive bone findings. IMPRESSION: 1. In light of recent surgery, TURBT in the urinary bladder, the presence of extraluminal gas and non enhancement of the bladder wall raising the question of subtle bladder injury/perforation along the LEFT posterolateral urinary bladder. No current signs of ascites. Findings are favored to be extraperitoneal though more extensive injury is difficult to exclude in the absence of a cystogram or similar evaluation with urinary bladder distension. Suggest urologic consultation with CT cystogram after consultation for further evaluation. 2. No pneumoperitoneum or ascites currently to indicate extra peritoneal injury though again lowered sensitivity on standard CT for this type of injury. 3. Long segment thickening of the colon raising the question of colitis with signs of potential enteritis. Findings are nonspecific and may be infectious in nature. Correlate with any signs of infectious colitis such as C difficile. Findings in the colon most pronounced in the sigmoid and are superimposed on extensive diverticular changes and are nonspecific. 4. Ovoid area near the vaginal introitus may represent a hemorrhagic or proteinaceous Bartholin's gland cyst and is present on the RIGHT. Correlate with physical exam. Findings are unchanged since previous imaging of October of 2020. 5. 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 Findings can be seen in the setting of pelvic congestion syndrome. 6. Aortic atherosclerosis. Aortic Atherosclerosis (ICD10-I70.0). These results were called by telephone at the time of interpretation on 06/18/2022 at 6:09 pm to provider [facility] , who verbally acknowledged these results. Electronically Signed   By: Zetta Bills M.D.   On: 06/18/2022 18:12     ------  Assessment:  77 y.o. female with recent TURBT with initial concern for possible perforation but CT cystogram demonstrated expected postoperative changes.  She denies any issues voiding at this time, UA unremarkable, labs within normal limits.  Her symptoms can more likely be attributed to the colitis also noted on initial CT scan.   Recommendations: - No urologic intervention indicated -Okay to discontinue Foley -Patient already has follow-up scheduled with Dr. Gloriann Loan early next week.  Discussed with Dr. Junious Silk   Thank you for this consult. Please contact the urology consult pager with any further questions/concerns.

## 2022-06-19 LAB — URINE CULTURE: Culture: <10000 — AB

## 2022-06-20 ENCOUNTER — Encounter (HOSPITAL_COMMUNITY): Payer: Self-pay

## 2022-06-20 ENCOUNTER — Inpatient Hospital Stay (HOSPITAL_COMMUNITY)
Admission: EM | Admit: 2022-06-20 | Discharge: 2022-06-22 | DRG: 392 | Disposition: A | Discharge status: Home / Self Care | Payer: Medicare Other | Attending: Internal Medicine | Admitting: Internal Medicine

## 2022-06-20 ENCOUNTER — Other Ambulatory Visit: Payer: Self-pay

## 2022-06-20 ENCOUNTER — Telehealth: Payer: Self-pay

## 2022-06-20 DIAGNOSIS — E559 Vitamin D deficiency, unspecified: Secondary | ICD-10-CM | POA: Diagnosis present

## 2022-06-20 DIAGNOSIS — K529 Noninfective gastroenteritis and colitis, unspecified: Secondary | ICD-10-CM | POA: Diagnosis present

## 2022-06-20 DIAGNOSIS — C679 Malignant neoplasm of bladder, unspecified: Secondary | ICD-10-CM | POA: Diagnosis present

## 2022-06-20 DIAGNOSIS — Z960 Presence of urogenital implants: Secondary | ICD-10-CM | POA: Diagnosis present

## 2022-06-20 DIAGNOSIS — K219 Gastro-esophageal reflux disease without esophagitis: Secondary | ICD-10-CM | POA: Diagnosis not present

## 2022-06-20 DIAGNOSIS — E7800 Pure hypercholesterolemia, unspecified: Secondary | ICD-10-CM | POA: Diagnosis present

## 2022-06-20 DIAGNOSIS — Z79899 Other long term (current) drug therapy: Secondary | ICD-10-CM | POA: Diagnosis not present

## 2022-06-20 DIAGNOSIS — Z9049 Acquired absence of other specified parts of digestive tract: Secondary | ICD-10-CM | POA: Diagnosis not present

## 2022-06-20 DIAGNOSIS — F419 Anxiety disorder, unspecified: Secondary | ICD-10-CM | POA: Diagnosis not present

## 2022-06-20 DIAGNOSIS — E872 Acidosis, unspecified: Secondary | ICD-10-CM | POA: Diagnosis present

## 2022-06-20 DIAGNOSIS — Z9221 Personal history of antineoplastic chemotherapy: Secondary | ICD-10-CM | POA: Diagnosis not present

## 2022-06-20 DIAGNOSIS — J438 Other emphysema: Secondary | ICD-10-CM | POA: Diagnosis not present

## 2022-06-20 DIAGNOSIS — K449 Diaphragmatic hernia without obstruction or gangrene: Secondary | ICD-10-CM | POA: Diagnosis present

## 2022-06-20 DIAGNOSIS — M810 Age-related osteoporosis without current pathological fracture: Secondary | ICD-10-CM | POA: Diagnosis not present

## 2022-06-20 DIAGNOSIS — E876 Hypokalemia: Secondary | ICD-10-CM | POA: Diagnosis present

## 2022-06-20 DIAGNOSIS — D72819 Decreased white blood cell count, unspecified: Secondary | ICD-10-CM | POA: Diagnosis present

## 2022-06-20 DIAGNOSIS — R319 Hematuria, unspecified: Secondary | ICD-10-CM | POA: Diagnosis present

## 2022-06-20 DIAGNOSIS — Z881 Allergy status to other antibiotic agents status: Secondary | ICD-10-CM

## 2022-06-20 LAB — CBC WITH DIFFERENTIAL/PLATELET
Abs Immature Granulocytes: 0.01 10*3/uL (ref 0.00–0.07)
Basophils Absolute: 0 10*3/uL (ref 0.0–0.1)
Basophils Relative: 1 %
Eosinophils Absolute: 0.1 10*3/uL (ref 0.0–0.5)
Eosinophils Relative: 2 %
HCT: 40.6 % (ref 36.0–46.0)
Hemoglobin: 13.7 g/dL (ref 12.0–15.0)
Immature Granulocytes: 0 %
Lymphocytes Relative: 36 %
Lymphs Abs: 1.4 10*3/uL (ref 0.7–4.0)
MCH: 31.1 pg (ref 26.0–34.0)
MCHC: 33.7 g/dL (ref 30.0–36.0)
MCV: 92.1 fL (ref 80.0–100.0)
Monocytes Absolute: 0.1 10*3/uL (ref 0.1–1.0)
Monocytes Relative: 2 %
Neutro Abs: 2.3 10*3/uL (ref 1.7–7.7)
Neutrophils Relative %: 59 %
Platelets: 248 10*3/uL (ref 150–400)
RBC: 4.41 MIL/uL (ref 3.87–5.11)
RDW: 12.7 % (ref 11.5–15.5)
WBC: 3.8 10*3/uL — ABNORMAL LOW (ref 4.0–10.5)
nRBC: 0 % (ref 0.0–0.2)

## 2022-06-20 LAB — URINALYSIS, ROUTINE W REFLEX MICROSCOPIC
Bacteria, UA: NONE SEEN
Bilirubin Urine: NEGATIVE
Glucose, UA: NEGATIVE mg/dL
Ketones, ur: 20 mg/dL — AB
Nitrite: NEGATIVE
Protein, ur: 100 mg/dL — AB
Specific Gravity, Urine: 1.024 (ref 1.005–1.030)
pH: 5 (ref 5.0–8.0)

## 2022-06-20 LAB — COMPREHENSIVE METABOLIC PANEL
ALT: 45 U/L — ABNORMAL HIGH (ref 0–44)
AST: 44 U/L — ABNORMAL HIGH (ref 15–41)
Albumin: 4.1 g/dL (ref 3.5–5.0)
Alkaline Phosphatase: 43 U/L (ref 38–126)
Anion gap: 11 (ref 5–15)
BUN: 12 mg/dL (ref 8–23)
CO2: 20 mmol/L — ABNORMAL LOW (ref 22–32)
Calcium: 8.7 mg/dL — ABNORMAL LOW (ref 8.9–10.3)
Chloride: 107 mmol/L (ref 98–111)
Creatinine, Ser: 0.97 mg/dL (ref 0.44–1.00)
GFR, Estimated: >60 mL/min (ref >60)
Glucose, Bld: 99 mg/dL (ref 70–99)
Potassium: 3.7 mmol/L (ref 3.5–5.1)
Sodium: 138 mmol/L (ref 135–145)
Total Bilirubin: 0.7 mg/dL (ref 0.3–1.2)
Total Protein: 8 g/dL (ref 6.5–8.1)

## 2022-06-20 LAB — LIPASE, BLOOD: Lipase: 33 U/L (ref 11–51)

## 2022-06-20 LAB — MAGNESIUM: Magnesium: 2.2 mg/dL (ref 1.7–2.4)

## 2022-06-20 MED ORDER — SODIUM CHLORIDE 0.9 % IV BOLUS
1000.0000 mL | Freq: Once | INTRAVENOUS | Status: AC
  Administered 2022-06-20: 1000 mL via INTRAVENOUS

## 2022-06-20 NOTE — ED Notes (Signed)
Pt. Had another BM

## 2022-06-20 NOTE — Telephone Encounter (Signed)
Patient called stating she will run out of her zofran this weekend that was given to her from the ED on the 7th due to colitis and she got antibiotics and imodium and zofran. She has a follow up appointment for 2-20 with you but is wondering if she can a refill of zofran that she got from the ED. Please advise

## 2022-06-20 NOTE — H&P (Signed)
History and Physical  Kara Reynolds [id number] DOB: [birth year] DOA: 06/20/2022  Referring physician: Diamantina Monks- PA, EDP  PCP: Mayer Camel, NP  Outpatient Specialists: GI, urology. Patient coming from: Home  Chief Complaint: Diarrhea.  HPI: Kara Reynolds is a 77 y.o. female with medical history significant for chronic anxiety, hyperlipidemia, mitral valve disorder, paroxysmal atrial tachycardia, recent diagnosis of bladder cancer followed by urology.  Underwent a transurethral resection of a bladder tumor on 06/16/2022 by Dr. Gloriann Loan.  States initially she felt okay after her procedure, however the day after, she began having trouble with vomiting as well as loose stools.  Endorses diffuse pain in her abdomen-with associated nausea and vomiting.  Initially presented to the ED on 06/18/2022 for the same complaints.  CT abdomen pelvis showed findings suggestive of colitis and enteritis.  The patient was going to be admitted at that time, however she wanted to go home.  Due to recurrent and persistent symptomatology despite sublingual Zofran and Imodium, she presented to the ED for further evaluation.  In the ED, vital signs are stable.  EDP discussed the case with GI who recommended initiating IV antibiotics Cipro and IV Flagyl, and IV fluid.  GI will see in consult.  ED Course: Tmax 98.4.  BP 131/65, pulse 84, respiratory 18, O2 saturation 100% on room air.  Lab studies remarkable for serum bicarb 20.  AST 44 ALT 45.  WBC 3.8.  Review of Systems: Review of systems as noted in the HPI. All other systems reviewed and are negative.   Past Medical History:  Diagnosis Date   Acute cystitis without hematuria 07/03/2016   Anxiety 06/29/2015   Last Assessment & Plan:  Needs refill on this does not use it daily at all , but it does help her and she would like to continue with this and never fills this consistently    Cancer [facility])    Bladder cancer   Chest discomfort 10/29/2016   Colon  polyp    Encounter for screening colonoscopy 01/03/2016   GERD (gastroesophageal reflux disease) 06/29/2015   Last Assessment & Plan:  We discussed the side effects of the PPIs and use of zantac alternating if she would like, and she feels the PPI is needed for her GERD and does helps her, discussed bone loss as well as memory issues with the class as well   Hypercholesteremia 06/29/2015   Last Assessment & Plan:  Update her fasting labs for her today and discussed diet which she and her husband are working on more with her weight as well   IBS (irritable bowel syndrome)    Malaise and fatigue 07/02/2015   Last Assessment & Plan:  Off and on for her, she tries to remain active is retired now worked at [employer] and has had blood exposure and she is going to have the hepatitis C panel drawn for this purpose   Mild mitral regurgitation    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 She isnt willing.  Ordering a new dexa.  Last Assessment & Plan:  Update her vitamin d level, she is not on meds for this and had her last exam this year in februray   Other emphysema ([place]) 07/03/2016   Palpitations 10/29/2016   Paroxysmal atrial tachycardia 10/29/2016   Postmenopausal 06/29/2015   Last Assessment & Plan:  Not using any hormones,  she feels she is doing well and has her mammograms done at [place]   Vitamin D deficiency 06/29/2015   Last Assessment & Plan:  Update her level for her today and see where she is at   Past Surgical History:  Procedure Laterality Date   APPENDECTOMY     CHOLECYSTECTOMY     CYSTOSCOPY W/ URETERAL STENT PLACEMENT Bilateral 06/16/2022   Procedure: BILATERAL RETROGRADE PYELOGRAM;  Surgeon: Lucas Mallow, MD;  Location: [facility];  Service: Urology;  Laterality: Bilateral;   left thumb surgery      TONSILLECTOMY     as a teenager   TRANSURETHRAL RESECTION OF  BLADDER TUMOR  01/13/2019    Social History:  reports that she has never smoked. She has never used smokeless tobacco. She reports that she does not drink alcohol and does not use drugs.   Allergies  Allergen Reactions   Amoxicillin-Pot Clavulanate Nausea Only    Family History  Problem Relation Age of Onset   Prostate cancer Father        died about 50 years ago   Stroke Maternal Grandfather    Diabetes Child    Colon cancer Neg Hx    Esophageal cancer Neg Hx       Prior to Admission medications   Medication Sig Start Date End Date Taking? Authorizing Provider  ALPRAZolam Duanne Moron) 0.5 MG tablet Take 0.5 mg by mouth at bedtime.    [provider]  cefUROXime (CEFTIN) 500 MG tablet Take 1 tablet (500 mg total) by mouth 2 (two) times daily with a meal for 7 days. 06/18/22 06/25/22  Dorie Rank, MD  dicyclomine (BENTYL) 10 MG capsule Take 1 capsule (10 mg total) by mouth 2 (two) times daily. Patient taking differently: Take 10 mg by mouth 2 (two) times daily as needed for spasms. 02/23/18   Jackquline Denmark, MD  loperamide (IMODIUM) 2 MG capsule Take 1 capsule (2 mg total) by mouth 4 (four) times daily as needed for diarrhea or loose stools. 06/18/22   Dorie Rank, MD  metoprolol tartrate (LOPRESSOR) 25 MG tablet Take 2 tablets (50 mg total) daily as needed by mouth. Patient taking differently: Take 25 mg by mouth 2 (two) times daily as needed (palpitations). 03/18/17   Richardo Priest, MD  metroNIDAZOLE (FLAGYL) 500 MG tablet Take 1 tablet (500 mg total) by mouth 2 (two) times daily. 06/18/22   Dorie Rank, MD  ondansetron (ZOFRAN-ODT) 8 MG disintegrating tablet Take 1 tablet (8 mg total) by mouth every 8 (eight) hours as needed for nausea or vomiting. 06/18/22   Dorie Rank, MD  pantoprazole (PROTONIX) 40 MG tablet TAKE 1 TABLET BY MOUTH EVERY DAY 06/13/19   Jackquline Denmark, MD  potassium chloride SA (KLOR-CON) 20 MEQ tablet Take 1 tablet (20 mEq total) by mouth daily for 7 days. Patient not  taking: Reported on 06/04/2022 02/11/19 02/18/19  Jackquline Denmark, MD  promethazine (PHENERGAN) 25 MG tablet Take 1 tablet (25 mg total) by mouth every 8 (eight) hours as needed for nausea or vomiting. Patient not taking: Reported on 06/04/2022 02/09/19   Jackquline Denmark, MD  traMADol (ULTRAM) 50 MG tablet Take 1 tablet (50 mg total) by mouth every 6 (six) hours as needed. 06/16/22 06/16/23  Lucas Mallow, MD    Physical Exam: BP (!) 142/68   Pulse 76   Temp 98.4 F (36.9 C)   Resp 16   SpO2 100%   General: 77 y.o. year-old female well  developed well nourished in no acute distress.  Alert and oriented x3. Cardiovascular: Regular rate and rhythm with no rubs or gallops.  No thyromegaly or JVD noted.  No lower extremity edema. 2/4 pulses in all 4 extremities. Respiratory: Clear to auscultation with no wheezes or rales. Good inspiratory effort. Abdomen: Soft diffusely tender nondistended with normal bowel sounds x4 quadrants. Muskuloskeletal: No cyanosis, clubbing or edema noted bilaterally Neuro: CN II-XII intact, strength, sensation, reflexes Skin: No ulcerative lesions noted or rashes Psychiatry: Judgement and insight appear anxious. Mood is appropriate for condition and setting          Labs on Admission:  Basic Metabolic Panel: Recent Labs  Lab 06/18/22 1541 06/20/22 1914 06/20/22 1922  NA 136 138  --   K 3.5 3.7  --   CL 103 107  --   CO2 24 20*  --   GLUCOSE 101* 99  --   BUN 18 12  --   CREATININE 0.69 0.97  --   CALCIUM 8.3* 8.7*  --   MG  --   --  2.2   Liver Function Tests: Recent Labs  Lab 06/18/22 1541 06/20/22 1914  AST 49* 44*  ALT 48* 45*  ALKPHOS 44 43  BILITOT 0.8 0.7  PROT 7.6 8.0  ALBUMIN 4.0 4.1   Recent Labs  Lab 06/18/22 1541 06/20/22 1914  LIPASE 43 33   No results for input(s): "AMMONIA" in the last 168 hours. CBC: Recent Labs  Lab 06/18/22 1541 06/20/22 1914  WBC 6.0 3.8*  NEUTROABS 5.2 2.3  HGB 13.3 13.7  HCT 39.8 40.6  MCV 93.4  92.1  PLT 215 248   Cardiac Enzymes: No results for input(s): "CKTOTAL", "CKMB", "CKMBINDEX", "TROPONINI" in the last 168 hours.  BNP (last 3 results) No results for input(s): "BNP" in the last 8760 hours.  ProBNP (last 3 results) No results for input(s): "PROBNP" in the last 8760 hours.  CBG: No results for input(s): "GLUCAP" in the last 168 hours.  Radiological Exams on Admission: No results found.  EKG: I independently viewed the EKG done and my findings are as followed: Sinus rhythm rate of 74.  Nonspecific ST-T changes.  QTc 413.  Assessment/Plan Present on Admission:  Colitis  Principal Problem:   Colitis  Colitis Diarrhea Presented with diarrhea and diffuse abdominal pain Started on empiric IV antibiotics ciprofloxacin and IV [place], continue Follow peripheral blood cultures taken on 06/18/2022 Pain control, antidiarrheal medication Gentle IV fluid hydration Replete electrolytes as indicated. GI Dr. Michail Sermon consulted, appreciate assistance  Mild normal anion gap metabolic acidosis Serum bicarb 20 Anion gap 11 Gentle IV fluid hydration LR at 50 cc/h x 2 days Repeat BMP in the morning.  Elevated liver chemistries in the setting of colitis AST and ALT mildly elevated Monitor for now  Leukopenia WBC 3.8K Monitor for now.  Chronic anxiety Resume home Xanax    DVT prophylaxis: Subcu enoxaparin daily  Code Status: Full, as stated by the patient and her medical POA at bedside  Family Communication: Updated her daughter at bedside who is also a hospice NP.  Disposition Plan: Admitted to telemetry unit  Consults called: GI consulted by EDP.  Admission status: Inpatient status.   Status is: Inpatient The patient requires at least 2 midnights for further evaluation and treatment of present condition.   Kayleen Memos MD [facility] Pager [phone]  If 7PM-7AM, please contact night-coverage [email or web address] Password [facility]  06/20/2022,  10:27 PM

## 2022-06-20 NOTE — ED Provider Notes (Signed)
[facility] Provider Note   CSN: [id number] Arrival date & time: 06/20/22  1806     History  No chief complaint on file.   Kara Reynolds is a 77 y.o. female, history of bladder cancer, who presents to the ED secondary to persistent diarrhea since 06/17/2022.  States that she came to the ER on 2/7, and was prescribed Imodium, cefuroxime, Flagyl, Imodium, and loperamide, for her diarrhea.  C. difficile was negative.  States that these helped a little bit, but that they progressively have gotten worse, and she is having 10+ stools a day, and has now even started having accidents on herself as she has intractable diarrhea.  She has been taking Zofran 8 mg 3 times a day, and Imodium multiple times a day without any relief.  She states she just feels very weak, and cannot keep anything down.  Initially was offered admission, but she declined. Home Medications Prior to Admission medications   Medication Sig Start Date End Date Taking? Authorizing Provider  ALPRAZolam Duanne Moron) 0.5 MG tablet Take 0.5 mg by mouth at bedtime.    [provider]  cefUROXime (CEFTIN) 500 MG tablet Take 1 tablet (500 mg total) by mouth 2 (two) times daily with a meal for 7 days. 06/18/22 06/25/22  Dorie Rank, MD  dicyclomine (BENTYL) 10 MG capsule Take 1 capsule (10 mg total) by mouth 2 (two) times daily. Patient taking differently: Take 10 mg by mouth 2 (two) times daily as needed for spasms. 02/23/18   Jackquline Denmark, MD  loperamide (IMODIUM) 2 MG capsule Take 1 capsule (2 mg total) by mouth 4 (four) times daily as needed for diarrhea or loose stools. 06/18/22   Dorie Rank, MD  metoprolol tartrate (LOPRESSOR) 25 MG tablet Take 2 tablets (50 mg total) daily as needed by mouth. Patient taking differently: Take 25 mg by mouth 2 (two) times daily as needed (palpitations). 03/18/17   Richardo Priest, MD  metroNIDAZOLE (FLAGYL) 500 MG tablet Take 1 tablet (500 mg total) by mouth 2  (two) times daily. 06/18/22   Dorie Rank, MD  ondansetron (ZOFRAN-ODT) 8 MG disintegrating tablet Take 1 tablet (8 mg total) by mouth every 8 (eight) hours as needed for nausea or vomiting. 06/18/22   Dorie Rank, MD  pantoprazole (PROTONIX) 40 MG tablet TAKE 1 TABLET BY MOUTH EVERY DAY 06/13/19   Jackquline Denmark, MD  potassium chloride SA (KLOR-CON) 20 MEQ tablet Take 1 tablet (20 mEq total) by mouth daily for 7 days. Patient not taking: Reported on 06/04/2022 02/11/19 02/18/19  Jackquline Denmark, MD  promethazine (PHENERGAN) 25 MG tablet Take 1 tablet (25 mg total) by mouth every 8 (eight) hours as needed for nausea or vomiting. Patient not taking: Reported on 06/04/2022 02/09/19   Jackquline Denmark, MD  traMADol (ULTRAM) 50 MG tablet Take 1 tablet (50 mg total) by mouth every 6 (six) hours as needed. 06/16/22 06/16/23  Lucas Mallow, MD      Allergies    Amoxicillin-pot clavulanate    Review of Systems   Review of Systems  Constitutional:  Negative for fever.  Gastrointestinal:  Positive for abdominal pain, diarrhea, nausea and vomiting.    Physical Exam Updated Vital Signs BP 135/72   Pulse 64   Temp 98.2 F (36.8 C) (Oral)   Resp 18   SpO2 98%  Physical Exam Vitals and nursing note reviewed.  Constitutional:      General: She is not in  acute distress.    Appearance: She is well-developed.  HENT:     Head: Normocephalic and atraumatic.  Eyes:     Conjunctiva/sclera: Conjunctivae normal.  Cardiovascular:     Rate and Rhythm: Normal rate and regular rhythm.     Heart sounds: No murmur heard. Pulmonary:     Effort: Pulmonary effort is normal. No respiratory distress.     Breath sounds: Normal breath sounds.  Abdominal:     General: There is no distension.     Palpations: Abdomen is soft.     Tenderness: There is generalized abdominal tenderness.  Musculoskeletal:        General: No swelling.     Cervical back: Neck supple.  Skin:    General: Skin is warm and dry.     Capillary Refill:  Capillary refill takes less than 2 seconds.  Neurological:     Mental Status: She is alert.  Psychiatric:        Mood and Affect: Mood normal.     ED Results / Procedures / Treatments   Labs (all labs ordered are listed, but only abnormal results are displayed) Labs Reviewed  CBC WITH DIFFERENTIAL/PLATELET - Abnormal; Notable for the following components:      Result Value   WBC 3.8 (*)    All other components within normal limits  COMPREHENSIVE METABOLIC PANEL - Abnormal; Notable for the following components:   CO2 20 (*)    Calcium 8.7 (*)    AST 44 (*)    ALT 45 (*)    All other components within normal limits  GASTROINTESTINAL PANEL BY PCR, STOOL (REPLACES STOOL CULTURE)  LIPASE, BLOOD  MAGNESIUM  URINALYSIS, ROUTINE W REFLEX MICROSCOPIC    EKG EKG Interpretation  Date/Time:  Friday June 20 2022 20:13:53 EST Ventricular Rate:  74 PR Interval:  174 QRS Duration: 93 QT Interval:  372 QTC Calculation: 413 R Axis:   0 Text Interpretation: Sinus rhythm Anterior infarct, old Confirmed by Lavenia Atlas [phone]) on 06/20/2022 8:52:32 PM  Radiology No results found.  Procedures Procedures    Medications Ordered in ED Medications  sodium chloride 0.9 % bolus 1,000 mL (1,000 mLs Intravenous New Bag/Given 06/20/22 2007)    ED Course/ Medical Decision Making/ A&P                             Medical Decision Making Patient is a 77 year old female, here for profuse diarrhea that is been going on for the last 3 days, she was seen at the ER on 2/7, and states with Zofran Imodium, cephalosporin, Flagyl, she is still having 1+ episode of diarrhea per hour, 10+ a day.  She states they are loose and watery, and will not stop.  She even defecated on herself this a.m. because she is having difficult time controlling her bowels.  We will obtain labs, including magnesium to evaluate for electrolyte abnormalities.  And start her on some IV fluids.  Amount and/or Complexity of  Data Reviewed Labs: ordered.    Details: Within normal limits Discussion of management or test interpretation with external provider(s): Discussed with GI, they are agreeable to consult, in the a.m., and believe that IV hydration would benefit patient.  We will admit him to the hospitalist, for IV hydration, Zofran, and symptomatic management.  GI recommends Cipro and Flagyl into the the cephalosporin and Flagyl, and symptomatic treatment.  Discussed with Dr. Nevada Crane, informed her of GI recommendations, and  recommend IV hydration.    Risk Decision regarding hospitalization.    Final Clinical Impression(s) / ED Diagnoses Final diagnoses:  Colitis    Rx / DC Orders ED Discharge Orders     None         Skyann Ganim Carlean Jews, PA 06/20/22 2230    Lorelle Gibbs, DO 06/20/22 2325

## 2022-06-20 NOTE — ED Triage Notes (Signed)
Pt had recent bladder surgery and since then has been having diarrhea. Pt was here the other day and did not want to be admitted and went home and back today for same. Also having nausea.

## 2022-06-21 DIAGNOSIS — K529 Noninfective gastroenteritis and colitis, unspecified: Secondary | ICD-10-CM | POA: Diagnosis not present

## 2022-06-21 LAB — CBC
HCT: 33.1 % — ABNORMAL LOW (ref 36.0–46.0)
Hemoglobin: 11.4 g/dL — ABNORMAL LOW (ref 12.0–15.0)
MCH: 31.8 pg (ref 26.0–34.0)
MCHC: 34.4 g/dL (ref 30.0–36.0)
MCV: 92.5 fL (ref 80.0–100.0)
Platelets: 179 10*3/uL (ref 150–400)
RBC: 3.58 MIL/uL — ABNORMAL LOW (ref 3.87–5.11)
RDW: 12.7 % (ref 11.5–15.5)
WBC: 2.4 10*3/uL — ABNORMAL LOW (ref 4.0–10.5)
nRBC: 0 % (ref 0.0–0.2)

## 2022-06-21 LAB — GASTROINTESTINAL PANEL BY PCR, STOOL (REPLACES STOOL CULTURE)

## 2022-06-21 LAB — COMPREHENSIVE METABOLIC PANEL
ALT: 34 U/L (ref 0–44)
AST: 32 U/L (ref 15–41)
Albumin: 3.1 g/dL — ABNORMAL LOW (ref 3.5–5.0)
Alkaline Phosphatase: 40 U/L (ref 38–126)
Anion gap: 7 (ref 5–15)
BUN: 9 mg/dL (ref 8–23)
CO2: 21 mmol/L — ABNORMAL LOW (ref 22–32)
Calcium: 8.1 mg/dL — ABNORMAL LOW (ref 8.9–10.3)
Chloride: 111 mmol/L (ref 98–111)
Creatinine, Ser: 0.63 mg/dL (ref 0.44–1.00)
GFR, Estimated: >60 mL/min (ref >60)
Glucose, Bld: 91 mg/dL (ref 70–99)
Potassium: 3.2 mmol/L — ABNORMAL LOW (ref 3.5–5.1)
Sodium: 139 mmol/L (ref 135–145)
Total Bilirubin: 0.5 mg/dL (ref 0.3–1.2)
Total Protein: 6 g/dL — ABNORMAL LOW (ref 6.5–8.1)

## 2022-06-21 LAB — PHOSPHORUS: Phosphorus: 2.6 mg/dL (ref 2.5–4.6)

## 2022-06-21 LAB — MAGNESIUM: Magnesium: 2 mg/dL (ref 1.7–2.4)

## 2022-06-21 MED ORDER — LACTATED RINGERS IV SOLN: INTRAVENOUS | Status: DC

## 2022-06-21 MED ORDER — CIPROFLOXACIN IN D5W 400 MG/200ML IV SOLN
400.0000 mg | Freq: Two times a day (BID) | INTRAVENOUS | Status: DC
  Administered 2022-06-21 – 2022-06-22 (×3): 400 mg via INTRAVENOUS
  Filled 2022-06-21 (×3): qty 200

## 2022-06-21 MED ORDER — LOPERAMIDE HCL 2 MG PO CAPS: 2.0000 mg | ORAL_CAPSULE | Freq: Four times a day (QID) | ORAL | Status: DC | PRN

## 2022-06-21 MED ORDER — METRONIDAZOLE 500 MG/100ML IV SOLN
500.0000 mg | Freq: Two times a day (BID) | INTRAVENOUS | Status: DC
  Administered 2022-06-21 – 2022-06-22 (×3): 500 mg via INTRAVENOUS
  Filled 2022-06-21 (×3): qty 100

## 2022-06-21 MED ORDER — ENOXAPARIN SODIUM 40 MG/0.4ML IJ SOSY
40.0000 mg | PREFILLED_SYRINGE | INTRAMUSCULAR | Status: DC
  Administered 2022-06-21 – 2022-06-22 (×2): 40 mg via SUBCUTANEOUS
  Filled 2022-06-21 (×2): qty 0.4

## 2022-06-21 MED ORDER — DICYCLOMINE HCL 10 MG PO CAPS: 10.0000 mg | ORAL_CAPSULE | Freq: Two times a day (BID) | ORAL | Status: DC | PRN

## 2022-06-21 MED ORDER — LIP MEDEX EX OINT: 1.0000 | TOPICAL_OINTMENT | CUTANEOUS | Status: DC | PRN

## 2022-06-21 MED ORDER — POTASSIUM CHLORIDE CRYS ER 20 MEQ PO TBCR
40.0000 meq | EXTENDED_RELEASE_TABLET | Freq: Once | ORAL | Status: AC
  Administered 2022-06-21: 40 meq via ORAL
  Filled 2022-06-21: qty 2

## 2022-06-21 MED ORDER — ALPRAZOLAM 0.5 MG PO TABS
0.5000 mg | ORAL_TABLET | Freq: Every day | ORAL | Status: DC
  Administered 2022-06-21 (×2): 0.5 mg via ORAL
  Filled 2022-06-21 (×2): qty 1

## 2022-06-21 NOTE — Progress Notes (Signed)
Mobility Specialist - Progress Note   06/21/22 1210  Mobility  Activity Ambulated independently in hallway  Level of Assistance Independent  Assistive Device None  Distance Ambulated (ft) 500 ft  Activity Response Tolerated well  Mobility Referral Yes  $Mobility charge 1 Mobility   Pt received in bathroom standing and agreeable to mobility. No complaints during session. Pt to recliner after session with all needs met.   [facility]

## 2022-06-21 NOTE — Progress Notes (Signed)
Pharmacy Antibiotic Note  KIMBRELY TORI is a 77 y.o. female admitted on 06/20/2022 with ongoing profuse diarrhea. C. difficile negative .  Pharmacy has been consulted to dose cipro for intra-abdominal infection.  Plan: Cipro 469m IV q12h Follow renal function and clinical course     Temp (24hrs), Avg:98.3 F (36.8 C), Min:98.2 F (36.8 C), Max:98.4 F (36.9 C)  Recent Labs  Lab 06/18/22 1541 06/18/22 1745 06/20/22 1914  WBC 6.0  --  3.8*  CREATININE 0.69  --  0.97  LATICACIDVEN  --  0.9  --     Estimated Creatinine Clearance: 46.5 mL/min (by C-G formula based on SCr of 0.97 mg/dL).    Allergies  Allergen Reactions   Amoxicillin-Pot Clavulanate Nausea Only     Thank you for allowing pharmacy to be a part of this patient's care.  EDolly RiasRPh 06/21/2022, 12:24 AM

## 2022-06-21 NOTE — Progress Notes (Signed)
PROGRESS NOTE  Kara Reynolds  [id number] DOB: 29-May-1945 DOA: 06/20/2022 PCP: Mayer Camel, NP   Brief Narrative: Patient is a 77 year old female with history of  anxiety, hyperlipidemia, paroxysmal atrial tachycardia, recent diagnosis of bladder cancer being followed by urology, status post transurethral resection of bladder tumor on 06/16/2022 by Dr. Gloriann Loan presented with vomiting, loose stools,nausea.  She presented to the emergency department initially on 06/18/2022 with these complaints and CT abdomen pelvis showed findings of colitis/enteritis.  Patient was advised to be admitted but she wanted to go home.  Due to persistent symptoms, she presented again to the emergency department on 06/20/2022.  On presentation,she was hemodynamically stable.  Patient was started on broad spectrum antibiotics.  GI pathogen panel sent.  GI consulted  Assessment & Plan:  Principal Problem:   Colitis   Colitis/diarrhea: Presented with diarrhea nausea, vomiting.  CT abdomen/pelvis done on 2/7 was suspicious for colitis/enteritis.  GI pathogen panel ordered.  Not resulted yet. Continue antiemetics, gentle IV fluids.  Diet has been advanced to regular. GI following.  Continue broad-spectrum antibiotics for today until we get the GI pathogen panel, we will plan to discontinue the antibiotics as soon as possible. Fecal calprotectin also sent as per GI. Abdomen is mostly benign on examination.  History of bladder cancer: Follows with urology as an outpatient.  Diagnosed about 3 years ago.  Status post transurethral resection of bladder tumor on 06/16/2022 by Dr. Gloriann Loan after found to have recurrence.  Given intravesical gemcitabine. CT cystogram pelvis done on 2/7 did not show strong evidence of perforation   Elevated liver enzymes: Mild,resolved  Leukopenia: Likely with H/O malignancy/chemo effect.  Continue to monitor  Chronic anxiety: Xanax  Hypokalemia: Supplemented with potassium       DVT  prophylaxis:enoxaparin (LOVENOX) injection 40 mg Start: 06/21/22 1000     Code Status: Full Code  Family Communication: Called and discussed with daughter on phone on 2/10  Patient status: Inpatient  Patient is from : Home  Anticipated discharge to: Home  Estimated DC date: Tomorrow   Consultants: GI  Procedures: None   Antimicrobials:  Anti-infectives (From admission, onward)    Start     Dose/Rate Route Frequency Ordered Stop   06/21/22 0115  ciprofloxacin (CIPRO) IVPB 400 mg        400 mg 200 mL/hr over 60 Minutes Intravenous Every 12 hours 06/21/22 0023     06/21/22 0100  metroNIDAZOLE (FLAGYL) IVPB 500 mg        500 mg 100 mL/hr over 60 Minutes Intravenous Every 12 hours 06/21/22 0011         Subjective: Patient seen and examined at bedside today.  Hemodynamically stable.  Comfortable.  Lying in bed.  No nausea, vomiting or abdominal pain during my evaluation.  Abdomen is mostly benign.  Had an episode of diarrhea this morning.  Objective: Vitals:   06/20/22 2222 06/20/22 2321 06/21/22 0442 06/21/22 0742  BP:  131/65 122/70 129/62  Pulse:  73 68 65  Resp:  18 18 18  $ Temp: 98.4 F (36.9 C) 98.2 F (36.8 C) 97.9 F (36.6 C) 97.9 F (36.6 C)  TempSrc:  Oral Oral Oral  SpO2:  100% 98% 98%    Intake/Output Summary (Last 24 hours) at 06/21/2022 0800 Last data filed at 06/21/2022 0456 Gross per 24 hour  Intake 486.27 ml  Output 300 ml  Net 186.27 ml   There were no vitals filed for this visit.  Examination:  General exam:  Overall comfortable, not in distress HEENT: PERRL Respiratory system:  no wheezes or crackles  Cardiovascular system: S1 & S2 heard, RRR.  Gastrointestinal system: Abdomen is nondistended, soft and nontender. Central nervous system: Alert and oriented Extremities: No edema, no clubbing ,no cyanosis Skin: No rashes, no ulcers,no icterus     Data Reviewed: I have personally reviewed following labs and imaging studies  CBC: Recent  Labs  Lab 06/18/22 1541 06/20/22 1914 06/21/22 0651  WBC 6.0 3.8* 2.4*  NEUTROABS 5.2 2.3  --   HGB 13.3 13.7 11.4*  HCT 39.8 40.6 33.1*  MCV 93.4 92.1 92.5  PLT 215 248 0000000   Basic Metabolic Panel: Recent Labs  Lab 06/18/22 1541 06/20/22 1914 06/20/22 1922 06/21/22 0651  NA 136 138  --  139  K 3.5 3.7  --  3.2*  CL 103 107  --  111  CO2 24 20*  --  21*  GLUCOSE 101* 99  --  91  BUN 18 12  --  9  CREATININE 0.69 0.97  --  0.63  CALCIUM 8.3* 8.7*  --  8.1*  MG  --   --  2.2 2.0  PHOS  --   --   --  2.6     Recent Results (from the past 240 hour(s))  C Difficile Quick Screen w PCR reflex     Status: None   Collection Time: 06/18/22  2:50 PM   Specimen: STOOL  Result Value Ref Range Status   C Diff antigen NEGATIVE NEGATIVE Final   C Diff toxin NEGATIVE NEGATIVE Final   C Diff interpretation No C. difficile detected.  Final    Comment: Performed at [facility], [place] [address], [place], [place] [address]  Urine Culture     Status: Abnormal   Collection Time: 06/18/22  3:54 PM   Specimen: Urine, Clean Catch  Result Value Ref Range Status   Specimen Description   Final    URINE, CLEAN CATCH Performed at [facility], [place] [address] [place], [place] [address]    Special Requests   Final    NONE Performed at [facility], [place] [address] [place], [place] [address]    Culture (A)  Final    <10,000 COLONIES/mL INSIGNIFICANT GROWTH Performed at [place] [address] [place], [place] [address]    Report Status 06/19/2022 FINAL  Final  Blood culture (routine x 2)     Status: None (Preliminary result)   Collection Time: 06/18/22  5:45 PM   Specimen: BLOOD LEFT ARM  Result Value Ref Range Status   Specimen Description   Final    BLOOD LEFT ARM Performed at [facility], [place] [address], [place], [place] [address]    Special Requests   Final    BOTTLES DRAWN AEROBIC AND ANAEROBIC Blood  Culture results may not be optimal due to an excessive volume of blood received in culture bottles Performed at [place] [address] [place], [place] [address]    Culture   Final    NO GROWTH 2 DAYS Performed at [place] [address] [place], [place] [address]    Report Status PENDING  Incomplete  Blood culture (routine x 2)     Status: None (Preliminary result)   Collection Time: 06/18/22  5:55 PM   Specimen: BLOOD RIGHT HAND  Result Value Ref Range Status   Specimen Description   Final  BLOOD RIGHT HAND Performed at [facility], [place] [address] [place], [place] [address]    Special Requests   Final    BOTTLES DRAWN AEROBIC AND ANAEROBIC Blood Culture results may not be optimal due to an inadequate volume of blood received in culture bottles Performed at [place] [address] [place], [place] [address]    Culture   Final    NO GROWTH 2 DAYS Performed at [place] [address] [place], [place] [address]    Report Status PENDING  Incomplete     Radiology Studies: No results found.  Scheduled Meds:  ALPRAZolam  0.5 mg Oral QHS   enoxaparin (LOVENOX) injection  40 mg Subcutaneous Q24H   potassium chloride  40 mEq Oral Once   Continuous Infusions:  ciprofloxacin 400 mg (06/21/22 0216)   lactated ringers 50 mL/hr at 06/21/22 0033   metronidazole 500 mg (06/21/22 0049)     LOS: 1 day   Shelly Coss, MD [facility] P2/02/2023, 8:00 AM

## 2022-06-21 NOTE — Progress Notes (Signed)
  Transition of Care [facility]) Screening Note   Patient Details  Name: Kara Reynolds Date of Birth: November 04, 1945   Transition of Care [facility]) CM/SW Contact:    Henrietta Dine, RN Phone Number: 06/21/2022, 5:03 PM    Transition of Care Department [facility]) has reviewed patient and no TOC needs have been identified at this time. We will continue to monitor patient advancement through interdisciplinary progression rounds. If new patient transition needs arise, please place a TOC consult.

## 2022-06-21 NOTE — Consult Note (Addendum)
Consultation  Referring Provider:   [facility] Primary Care Physician:  Mayer Camel, NP Primary Gastroenterologist:  Dr. Lyndel Safe       Reason for Consultation:    Colitis    Impression    Colitis/enteritis with associated nausea, vomiting, abdominal pain and diarrhea Colonoscopy 2018 pancolonic diverticulosis predominantly sigmoid colon Recent TURPT with  intravesical gemcitabine for bladder cancer 02/05 C. difficile negative, no associated leukocytosis or anemia.  Elevated LFTs AST 44, ALT 45 improved to AST 32 and ALT 34 Status post cholecystectomy and previous ERCP with biliary sphincterotomy  Leukopenia and anemia suspicious for medication reaction/infection Monitor  history of bladder cancer  status post TURBT with gemcitabine on 06/16/2022    LOS: 1 day     Plan   77 year old female with history of IBS mixed, diverticulosis predominate sigmoid, recent TURPT with  intravesical gemcitabine for bladder cancer 02/05 presents with acute onset diarrhea, nausea and vomiting and colitis seen on CT.  Possible infectious with acute onset, no leukocytosis, negative C. difficile, pending GI pathogen. With presentation directly after TURBT with intravesical gemcitabine, raises the question if this could have potential side effect or potentially microperforation leading colitis, previous cystoscopy on the seventh showing outpouching of contrast.  -Has had 3 days of antibiotics, consider continuing for 7 days total -Pending GI pathogen panel -Continue supportive care -IVF -Zofran as needed  Do not anticipate need for endoscopic evaluation at this time Further recommendations per Dr. Lyndel Safe Thank you for your kind consultation, we will continue to follow.         HPI:   Kara Reynolds is a 77 y.o. female with past medical history significant for paroxysmal atrial tachycardia, mild MR, history of Chiari malformation, history of bladder cancer status post TURBT  with gemcitabine on 06/16/2022, IBS mixed, GERD, diverticulosis, presents with colitis.  Past GI history: -Status post ERCP  12/05/2021 with biliary sphincterotomy and stent insertion after lap chole -EGD 03/15/2014: Small hiatal hernia, mild gastritis, negative small bowel biopsies for celiac, negative biopsies for HP. -Colonoscopy 07/09/2016 showing pancolonic diverticulosis predominantly in the sigmoid colon. -EGD 02/23/2018 Dr. Lyndel Safe for nausea and vomiting small hiatal hernia, mild gastritis, incidental small gastric polyps status post polypectomy x 3.  Negative EOE, benign polyps  01/13/2019 history of low-grade superficial bladder cancer resected 06/16/2022.found to have bladder cancer on cystoscopy  with subsequent transurethral resection of bladder tumor with left urethral stent and intravesical gemcitabine  06/18/2022 presented to ER with vomiting and diarrhea with abdominal pain CT showed long segment thickening of colon raising question of colitis, nonspecific infectious in nature.  Most pronounced in the sigmoid and are superimposed on extensive diverticular changes.  No leukocytosis at the time, AST 49, ALT 48 urine showed moderate Hgb, 100 protein, leukocyte small, bacteria rare and culture was less than 10,000.  Blood cultures negative day 3.  Lipase negative. C. difficile quick screen negative Patient had cystogram that showed small mildly irregular 8 mm focal outpouching of contrast left side of urinary bladder mild thickening possibly from residual tumor versus inflammation.  Urology consulted and states this was unremarkable post-TURPT. Patient was given cefuroxime 500 mg twice daily and metronidazole 500 mg twice daily supportive care Imodium and ondansetron. 06/20/2022 patient presented again to the ER with ongoing profuse diarrhea and weakness.  10+ stools a day not responsive to antibiotics or Imodium. Labs in the ER showed WBC 3.8, no anemia.  AST 44, ALT 45, no electrolyte, kidney  abnormalities.  Patient's vitals stable.  No family was present at the time of my evaluation. Patient lying in bed.  States normally she has baseline constipation will eat 2 prunes a day and this is helpful. Patient was in normal state of health after her procedure until 1-1/2 2 days after she began to have acute onset diarrhea with abdominal discomfort and nausea, only 1 episode of nonbloody emesis. Patient states she was having up to 10+ stools a day, watery with small amount of sediment.  Denies melena or hematochezia. Was having nocturnal symptoms as well getting up 3-4 times during the night, had 1 episode of fecal incontinence during the night. Patient states since being in the ER she has had several episodes of bowel movements had another 1 at 4 AM at 730.  States bowel movements are slowing down, denies any abdominal pain at this time, states nausea has improved and she is hungry and wants to try to eat something. Patient has been hot but afebrile at home. No associated sick contacts.  No new medications other than antibiotics.  Abnormal ED labs: Abnormal Labs Reviewed  CBC WITH DIFFERENTIAL/PLATELET - Abnormal; Notable for the following components:      Result Value   WBC 3.8 (*)    All other components within normal limits  COMPREHENSIVE METABOLIC PANEL - Abnormal; Notable for the following components:   CO2 20 (*)    Calcium 8.7 (*)    AST 44 (*)    ALT 45 (*)    All other components within normal limits  URINALYSIS, ROUTINE W REFLEX MICROSCOPIC - Abnormal; Notable for the following components:   APPearance HAZY (*)    Hgb urine dipstick MODERATE (*)    Ketones, ur 20 (*)    Protein, ur 100 (*)    Leukocytes,Ua SMALL (*)    All other components within normal limits  CBC - Abnormal; Notable for the following components:   WBC 2.4 (*)    RBC 3.58 (*)    Hemoglobin 11.4 (*)    HCT 33.1 (*)    All other components within normal limits  COMPREHENSIVE METABOLIC PANEL -  Abnormal; Notable for the following components:   Potassium 3.2 (*)    CO2 21 (*)    Calcium 8.1 (*)    Total Protein 6.0 (*)    Albumin 3.1 (*)    All other components within normal limits     Past Medical History:  Diagnosis Date   Acute cystitis without hematuria 07/03/2016   Anxiety 06/29/2015   Last Assessment & Plan:  Needs refill on this does not use it daily at all , but it does help her and she would like to continue with this and never fills this consistently    Cancer [facility])    Bladder cancer   Chest discomfort 10/29/2016   Colon polyp    Encounter for screening colonoscopy 01/03/2016   GERD (gastroesophageal reflux disease) 06/29/2015   Last Assessment & Plan:  We discussed the side effects of the PPIs and use of zantac alternating if she would like, and she feels the PPI is needed for her GERD and does helps her, discussed bone loss as well as memory issues with the class as well   Hypercholesteremia 06/29/2015   Last Assessment & Plan:  Update her fasting labs for her today and discussed diet which she and her husband are working on more with her weight as well   IBS (irritable bowel syndrome)  Malaise and fatigue 07/02/2015   Last Assessment & Plan:  Off and on for her, she tries to remain active is retired now worked at [employer] and has had blood exposure and she is going to have the hepatitis C panel drawn for this purpose   Mild mitral regurgitation    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 She isnt willing.  Ordering a new dexa.  Last Assessment & Plan:  Update her vitamin d level, she is not on meds for this and had her last exam this year in februray   Other emphysema ([place]) 07/03/2016   Palpitations 10/29/2016   Paroxysmal atrial tachycardia 10/29/2016   Postmenopausal 06/29/2015   Last Assessment & Plan:  Not using any hormones, she feels  she is doing well and has her mammograms done at [place]   Vitamin D deficiency 06/29/2015   Last Assessment & Plan:  Update her level for her today and see where she is at    Surgical History:  She  has a past surgical history that includes Cholecystectomy; Appendectomy; Tonsillectomy; Transurethral resection of bladder tumor (01/13/2019); left thumb surgery ; and Cystoscopy w/ ureteral stent placement (Bilateral, 06/16/2022). Family History:  Her family history includes Diabetes in her child; Prostate cancer in her father; Stroke in her maternal grandfather. Social History:   reports that she has never smoked. She has never used smokeless tobacco. She reports that she does not drink alcohol and does not use drugs.  Prior to Admission medications   Medication Sig Start Date End Date Taking? Authorizing Provider  ALPRAZolam Duanne Moron) 0.5 MG tablet Take 0.5 mg by mouth at bedtime as needed for sleep.   Yes [provider]  cefUROXime (CEFTIN) 500 MG tablet Take 1 tablet (500 mg total) by mouth 2 (two) times daily with a meal for 7 days. 06/18/22 06/25/22 Yes Dorie Rank, MD  dicyclomine (BENTYL) 10 MG capsule Take 1 capsule (10 mg total) by mouth 2 (two) times daily. Patient taking differently: Take 10 mg by mouth 2 (two) times daily as needed for spasms. 02/23/18  Yes Jackquline Denmark, MD  ibuprofen (ADVIL) 200 MG tablet Take 200 mg by mouth every 6 (six) hours as needed for mild pain.   Yes [provider]  loperamide (IMODIUM) 2 MG capsule Take 1 capsule (2 mg total) by mouth 4 (four) times daily as needed for diarrhea or loose stools. 06/18/22  Yes Dorie Rank, MD  metoprolol tartrate (LOPRESSOR) 25 MG tablet Take 2 tablets (50 mg total) daily as needed by mouth. Patient taking differently: Take 25 mg by mouth 2 (two) times daily as needed (palpitations). 03/18/17  Yes Richardo Priest, MD  metroNIDAZOLE (FLAGYL) 500 MG tablet Take 1 tablet (500 mg total) by mouth 2 (two) times daily.  06/18/22  Yes Dorie Rank, MD  ondansetron (ZOFRAN-ODT) 8 MG disintegrating tablet Take 1 tablet (8 mg total) by mouth every 8 (eight) hours as needed for nausea or vomiting. 06/18/22  Yes Dorie Rank, MD  pantoprazole (PROTONIX) 40 MG tablet TAKE 1 TABLET BY MOUTH EVERY DAY Patient taking differently: Take 40 mg by mouth daily. 06/13/19  Yes Jackquline Denmark, MD  traMADol (ULTRAM) 50 MG tablet Take 1 tablet (50 mg total) by mouth every 6 (six) hours as needed. Patient taking differently: Take 50 mg by mouth every 6 (six) hours as needed  for moderate pain. 06/16/22 06/16/23 Yes Lucas Mallow, MD    Current Facility-Administered Medications  Medication Dose Route Frequency Provider Last Rate Last Admin   ALPRAZolam Duanne Moron) tablet 0.5 mg  0.5 mg Oral QHS Irene Pap N, DO   0.5 mg at 06/21/22 0049   ciprofloxacin (CIPRO) IVPB 400 mg  400 mg Intravenous Q12H Angela Adam, RPH 200 mL/hr at 06/21/22 0216 400 mg at 06/21/22 0216   dicyclomine (BENTYL) capsule 10 mg  10 mg Oral BID PRN Kayleen Memos, DO       enoxaparin (LOVENOX) injection 40 mg  40 mg Subcutaneous Q24H Hall, Carole N, DO       lactated ringers infusion   Intravenous Continuous Shelly Coss, MD 50 mL/hr at 06/21/22 0033 New Bag at 06/21/22 0033   lip balm (CARMEX) ointment 1 Application  1 Application Topical PRN Kayleen Memos, DO       loperamide (IMODIUM) capsule 2 mg  2 mg Oral QID PRN Kayleen Memos, DO       metroNIDAZOLE (FLAGYL) IVPB 500 mg  500 mg Intravenous Q12H Alee, Rothkopf N, DO 100 mL/hr at 06/21/22 0049 500 mg at 06/21/22 0049   potassium chloride SA (KLOR-CON M) CR tablet 40 mEq  40 mEq Oral Once Shelly Coss, MD        Allergies as of 06/20/2022 - Review Complete 06/20/2022  Allergen Reaction Noted   Amoxicillin-pot clavulanate Nausea Only 12/31/2015    Review of Systems:    Constitutional: No weight loss, fever, chills, weakness or fatigue HEENT: Eyes: No change in vision               Ears, Nose, Throat:   No change in hearing or congestion Skin: No rash or itching Cardiovascular: No chest pain, chest pressure or palpitations   Respiratory: No SOB or cough Gastrointestinal: See HPI and otherwise negative Genitourinary: No dysuria or change in urinary frequency Neurological: No headache, dizziness or syncope Musculoskeletal: No new muscle or joint pain Hematologic: No bleeding or bruising Psychiatric: No history of depression or anxiety     Physical Exam:  Vital signs in last 24 hours: Temp:  [97.9 F (36.6 C)-98.4 F (36.9 C)] 97.9 F (36.6 C) (02/10 0742) Pulse Rate:  [64-83] 65 (02/10 0742) Resp:  [16-18] 18 (02/10 0742) BP: (122-153)/(62-72) 129/62 (02/10 0742) SpO2:  [96 %-100 %] 98 % (02/10 0742) Last BM Date : 06/21/22 Last BM recorded by nurses in past 5 days Stool Type: Type 7 (Liquid consistency with no solid pieces) (06/21/2022  4:56 AM)  General:   Pleasant, well developed female in no acute distress Head:  Normocephalic and atraumatic. Eyes: sclerae anicteric,conjunctive pink  Heart:  regular rate and rhythm Pulm: Clear anteriorly; no wheezing Abdomen:  Soft, Obese AB, Active bowel sounds. No tenderness , No organomegaly appreciated. Extremities:  Without edema. Msk:  Symmetrical without gross deformities. Peripheral pulses intact.  Neurologic:  Alert and  oriented x4;  No focal deficits.  Skin:   Dry and intact without significant lesions or rashes. Psychiatric:  Cooperative. Normal mood and affect.  LAB RESULTS: Recent Labs    06/18/22 1541 06/20/22 1914 06/21/22 0651  WBC 6.0 3.8* 2.4*  HGB 13.3 13.7 11.4*  HCT 39.8 40.6 33.1*  PLT 215 248 179   BMET Recent Labs    06/18/22 1541 06/20/22 1914 06/21/22 0651  NA 136 138 139  K 3.5 3.7 3.2*  CL 103 107 111  CO2 24  20* 21*  GLUCOSE 101* 99 91  BUN 18 12 9  $ CREATININE 0.69 0.97 0.63  CALCIUM 8.3* 8.7* 8.1*   LFT Recent Labs    06/21/22 0651  PROT 6.0*  ALBUMIN 3.1*  AST 32  ALT 34  ALKPHOS  40  BILITOT 0.5   PT/INR No results for input(s): "LABPROT", "INR" in the last 72 hours.  STUDIES: No results found.   Vladimir Crofts  06/21/2022, 8:24 AM    Attending physician's note   I have taken history, reviewed the chart and examined the patient. I performed a substantive portion of this encounter, including complete performance of at least one of the key components, in conjunction with the APP. I agree with the Advanced Practitioner's note, impression and recommendations.   Colitis/enteritis- ?etiology. Likely d/t intravesicular gemcitabine or A/Bs. Neg C Diff. GI pathogens-pending. 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  Also has assoc mild leukopenia. N/V appears to have resolved.  Bladder TCC s/p TURBT 2/5, initially suspected bladder injury-likely postoperative changes. No abdo pain or tenderness  GERD with small HH  Plan: -IVF -Follow stool studies. Add fecal calprotectin (done) -Continue supportive Rx. -No plans for any endoscopic procedures currently.  Would consider FS with Bx only if continued diarrhea. -If any lower Abdo pain, would consider MRI pelvis -if stool studies are neg, trial of Imodium. -OK to D/C A/Bs. -Will follow along   Carmell Austria, MD [facility] [phone]

## 2022-06-22 DIAGNOSIS — K529 Noninfective gastroenteritis and colitis, unspecified: Secondary | ICD-10-CM | POA: Diagnosis not present

## 2022-06-22 LAB — CBC
HCT: 32.3 % — ABNORMAL LOW (ref 36.0–46.0)
Hemoglobin: 11.4 g/dL — ABNORMAL LOW (ref 12.0–15.0)
MCH: 31.9 pg (ref 26.0–34.0)
MCHC: 35.3 g/dL (ref 30.0–36.0)
MCV: 90.5 fL (ref 80.0–100.0)
Platelets: 169 10*3/uL (ref 150–400)
RBC: 3.57 MIL/uL — ABNORMAL LOW (ref 3.87–5.11)
RDW: 12.5 % (ref 11.5–15.5)
WBC: 2.3 10*3/uL — ABNORMAL LOW (ref 4.0–10.5)
nRBC: 0 % (ref 0.0–0.2)

## 2022-06-22 LAB — BASIC METABOLIC PANEL
Anion gap: 10 (ref 5–15)
BUN: 6 mg/dL — ABNORMAL LOW (ref 8–23)
CO2: 22 mmol/L (ref 22–32)
Calcium: 8.4 mg/dL — ABNORMAL LOW (ref 8.9–10.3)
Chloride: 109 mmol/L (ref 98–111)
Creatinine, Ser: 0.61 mg/dL (ref 0.44–1.00)
GFR, Estimated: >60 mL/min (ref >60)
Glucose, Bld: 91 mg/dL (ref 70–99)
Potassium: 3.3 mmol/L — ABNORMAL LOW (ref 3.5–5.1)
Sodium: 141 mmol/L (ref 135–145)

## 2022-06-22 MED ORDER — POTASSIUM CHLORIDE CRYS ER 20 MEQ PO TBCR
40.0000 meq | EXTENDED_RELEASE_TABLET | Freq: Once | ORAL | Status: AC
  Administered 2022-06-22: 40 meq via ORAL
  Filled 2022-06-22: qty 2

## 2022-06-22 MED ORDER — POTASSIUM CHLORIDE CRYS ER 20 MEQ PO TBCR: 40.0000 meq | EXTENDED_RELEASE_TABLET | Freq: Every day | ORAL | 0 refills | Status: DC

## 2022-06-22 NOTE — Plan of Care (Signed)

## 2022-06-22 NOTE — Progress Notes (Signed)
     Progress Note    ASSESSMENT AND PLAN:   Colitis/enteritis- Resolved. Likely d/t intravesicular gemcitabine or A/Bs. Stool + Rotavirus. Neg C Diff. 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  Also has assoc mild leukopenia. 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 -D/W Dr Tawanna Solo   SUBJECTIVE   Feels much better. Diarrhea has resolved. No abdominal pain Would like to go home.    OBJECTIVE:     Vital signs in last 24 hours: Temp:  [97.8 F (36.6 C)-98 F (36.7 C)] 97.8 F (36.6 C) (02/11 0626) Pulse Rate:  [65-71] 71 (02/11 0626) Resp:  [18] 18 (02/11 0626) BP: (110-135)/(59-66) 135/66 (02/11 0626) SpO2:  [97 %-98 %] 97 % (02/11 0626) Last BM Date : 06/22/22 General:   Alert, well-developed female in NAD EENT:  Normal hearing, non icteric sclera, conjunctive pink.  Not examined   Intake/Output from previous day: 02/10 0701 - 02/11 0700 In: 2119.4 [P.O.:360; I.V.:1319.7; IV Piggyback:439.7] Out: -  Intake/Output this shift: Total I/O In: 120 [P.O.:120] Out: -   Lab Results: Recent Labs    06/20/22 1914 06/21/22 0651 06/22/22 0614  WBC 3.8* 2.4* 2.3*  HGB 13.7 11.4* 11.4*  HCT 40.6 33.1* 32.3*  PLT 248 179 169   BMET Recent Labs    06/20/22 1914 06/21/22 0651 06/22/22 0614  NA 138 139 141  K 3.7 3.2* 3.3*  CL 107 111 109  CO2 20* 21* 22  GLUCOSE 99 91 91  BUN 12 9 6*  CREATININE 0.97 0.63 0.61  CALCIUM 8.7* 8.1* 8.4*   LFT Recent Labs    06/21/22 0651  PROT 6.0*  ALBUMIN 3.1*  AST 32  ALT 34  ALKPHOS 40  BILITOT 0.5   PT/INR No results for input(s): "LABPROT", "INR" in the last 72 hours. Hepatitis Panel No results for input(s): "HEPBSAG", "HCVAB", "HEPAIGM", "HEPBIGM" in the last 72 hours.  No results found.   Principal Problem:   Colitis     LOS: 2 days     Carmell Austria, MD 06/22/2022, 9:23  [facility] [phone]

## 2022-06-22 NOTE — Progress Notes (Signed)
Pt discharged home with spouse in stable condition.  No immediate questions or concerns at this time. Scripts sent to pharmacy of choice. No immediate questions or concerns at this time. Discharged via wheelchair.

## 2022-06-22 NOTE — Discharge Summary (Signed)
Physician Discharge Summary  Kara Reynolds [id number] DOB: Feb 14, 1946 DOA: 06/20/2022  PCP: Mayer Camel, NP  Admit date: 06/20/2022 Discharge date: 06/22/2022  Admitted From: Home Disposition:  Home  Discharge Condition:Stable CODE STATUS:FULL Diet recommendation: Heart Healthy  Brief/Interim Summary: Patient is a 77 year old female with history of  anxiety, hyperlipidemia, paroxysmal atrial tachycardia, recent diagnosis of bladder cancer being followed by urology, status post transurethral resection of bladder tumor on 06/16/2022 by Dr. Gloriann Loan presented with vomiting, loose stools,nausea.  She presented to the emergency department initially on 06/18/2022 with these complaints and CT abdomen pelvis showed findings of colitis/enteritis.  Patient was advised to be admitted but she wanted to go home.  Due to persistent symptoms, she presented again to the emergency department on 06/20/2022.  On presentation,she was hemodynamically stable.  Patient was started on broad spectrum antibiotics.  GI pathogen panel sent.  GI consulted .  GI pathogen panel showed rotavirus.  Her abdominal discomfort, diarrhea has been improving.  Medically stable for discharge home today.  Antibiotics discontinued  Following problems were addressed during the hospitalization:  Colitis/diarrhea: Presented with diarrhea nausea, vomiting.  CT abdomen/pelvis done on 2/7 was suspicious for colitis/enteritis.  GI pathogen panel ordered which showed rotavirus.Abdomen is mostly benign on examination.  Diarrhea has slowed down.  GI cleared for discharge   History of bladder cancer: Follows with urology as an outpatient.  Diagnosed about 3 years ago.  Status post transurethral resection of bladder tumor on 06/16/2022 by Dr. Gloriann Loan after found to have recurrence.  Given intravesical gemcitabine. CT cystogram pelvis done on 2/7 did not show strong evidence of perforation .   Elevated liver enzymes: Mild,resolved   Leukopenia:  Likely with H/O malignancy/chemo effect.  Continue to monitor as an  outpatient   Chronic anxiety: Xanax   Hypokalemia: Supplemented with potassium   Discharge Diagnoses:  Principal Problem:   Colitis    Discharge Instructions  Discharge Instructions     Diet - low sodium heart healthy   Complete by: As directed    Discharge instructions   Complete by: As directed    1)Follow up with urology as an outpatient   Increase activity slowly   Complete by: As directed       Allergies as of 06/22/2022       Reactions   Amoxicillin-pot Clavulanate Nausea Only   Pseudoephedrine Palpitations        Medication List     STOP taking these medications    cefUROXime 500 MG tablet Commonly known as: CEFTIN   metroNIDAZOLE 500 MG tablet Commonly known as: FLAGYL       TAKE these medications    ALPRAZolam 0.5 MG tablet Commonly known as: XANAX Take 0.5 mg by mouth at bedtime as needed for sleep.   dicyclomine 10 MG capsule Commonly known as: BENTYL Take 1 capsule (10 mg total) by mouth 2 (two) times daily. What changed:  when to take this reasons to take this   ibuprofen 200 MG tablet Commonly known as: ADVIL Take 200 mg by mouth every 6 (six) hours as needed for mild pain.   loperamide 2 MG capsule Commonly known as: IMODIUM Take 1 capsule (2 mg total) by mouth 4 (four) times daily as needed for diarrhea or loose stools.   metoprolol tartrate 25 MG tablet Commonly known as: Lopressor Take 2 tablets (50 mg total) daily as needed by mouth. What changed:  how much to take when to take this reasons to take this  ondansetron 8 MG disintegrating tablet Commonly known as: ZOFRAN-ODT Take 1 tablet (8 mg total) by mouth every 8 (eight) hours as needed for nausea or vomiting.   pantoprazole 40 MG tablet Commonly known as: PROTONIX TAKE 1 TABLET BY MOUTH EVERY DAY   potassium chloride SA 20 MEQ tablet Commonly known as: KLOR-CON M Take 2 tablets (40 mEq  total) by mouth daily for 5 days. Start taking on: June 23, 2022   traMADol 50 MG tablet Commonly known as: Ultram Take 1 tablet (50 mg total) by mouth every 6 (six) hours as needed. What changed: reasons to take this        Allergies  Allergen Reactions   Amoxicillin-Pot Clavulanate Nausea Only   Pseudoephedrine Palpitations    Consultations: gI   Procedures/Studies: CT CYSTOGRAM PELVIS  Result Date: 06/18/2022 CLINICAL DATA:  Bladder injury, bladder surgery 2 days prior to imaging EXAM: CT CYSTOGRAM (CT PELVIS WITH CONTRAST) TECHNIQUE: Multidetector CT imaging through the pelvis was performed after dilute contrast had been introduced into the bladder for the purposes of performing CT cystography. RADIATION DOSE REDUCTION: This exam was performed according to the departmental dose-optimization program which includes automated exposure control, adjustment of the mA and/or kV according to patient size and/or use of iterative reconstruction technique. CONTRAST:  62m OMNIPAQUE IOHEXOL 300 MG/ML  SOLN COMPARISON:  CT abdomen 06/18/2022 FINDINGS: Urinary Tract: Foley catheter observed in the urinary bladder which is moderately distended with Along the left side of the urinary bladder, just anterior to the UVJ, there is a mildly irregular focal outpouching of contrast adjacent to the small locules of gas in this vicinity, as shown on image 54 series 4. This is focal and irregular although somewhat contained without a large leak. The contrast extends about 8 mm lateral to the expected margin of the urinary bladder on image 54 series 4. 4 mm of localized mild bladder wall thickening along the anterior margin of this process for example on image 32 series 2, which could be from inflammation or residual tumor. Ureters unremarkable. Bowel:  Sigmoid colon diverticulosis. Vascular/Lymphatic: Atherosclerosis is present, including aortoiliac atherosclerotic disease. No pathologic adenopathy.  Reproductive:  Unremarkable Other:  No supplemental non-categorized findings. Musculoskeletal: Unremarkable IMPRESSION: 1. There is a small contained mildly irregular 8 mm focal outpouching of contrast along the left side of the urinary bladder just anterior to the left UVJ. The lack of substantial spread in the intraperitoneal or extraperitoneal space raises suspicion that this is contained or partially healed. Adjacent small amount of extraperitoneal gas in the soft tissues lateral to the left side of the urinary bladder. 2. Mild localized bladder wall thickening along the anterior margin of this process which could be from inflammation or residual tumor. 3. Foley catheter in the urinary bladder. 4. Sigmoid colon diverticulosis. 5. Aortoiliac atherosclerotic disease. 6. Aortic atherosclerosis. Aortic Atherosclerosis (ICD10-I70.0). Electronically Signed   By: WVan ClinesM.D.   On: 06/18/2022 20:40   CT ABDOMEN PELVIS W CONTRAST  Result Date: 06/18/2022 CLINICAL DATA:  A 77year old female post bladder surgery presents with RIGHT lower quadrant abdominal pain. History of bladder neoplasm. * Tracking Code: BO * EXAM: CT ABDOMEN AND PELVIS WITH CONTRAST TECHNIQUE: Multidetector CT imaging of the abdomen and pelvis was performed using the standard protocol following bolus administration of intravenous contrast. RADIATION DOSE REDUCTION: This exam was performed according to the departmental dose-optimization program which includes automated exposure control, adjustment of the mA and/or kV according to patient size and/or use of iterative  reconstruction technique. CONTRAST:  129m OMNIPAQUE IOHEXOL 300 MG/ML  SOLN COMPARISON:  February 17, 2019 FINDINGS: Lower chest: Basilar atelectasis. No effusion. No consolidative changes. No chest wall abnormality to the extent evaluated. Hepatobiliary: Smooth hepatic contours. Query mild hepatic steatosis. Post cholecystectomy without biliary duct distension. No focal,  suspicious hepatic lesion. Portal vein is patent. Pancreas: Normal, without mass, inflammation or ductal dilatation. Spleen: Normal. Adrenals/Urinary Tract: Adrenal glands are normal. Symmetric renal enhancement without hydronephrosis or perinephric stranding. No suspicious renal lesion. Thickening of the urinary bladder wall. Small amounts of gas in the urinary bladder. Small amounts of gas adjacent to the urinary bladder particularly on the LEFT. No definitive signs of focal fluid collection but hypoattenuation along the LEFT urinary bladder wall adjacent to small locules of air with a potential small fluid collection measuring 12 by 18 mm. Stomach/Bowel: Stomach without signs of adjacent stranding. No sign of small bowel obstruction. Nondilated small bowel as liquid filled beyond the distal jejunum. Irregularity of the terminal ileum with mural stratification. Liquid stool in the colon. Query mild thickening of the sigmoid superimposed on diverticular changes present in this location. Vascular/Lymphatic: Aortic atherosclerosis. No sign of aneurysm. Smooth contour of the IVC. There is no gastrohepatic or hepatoduodenal ligament lymphadenopathy. No retroperitoneal or mesenteric lymphadenopathy. No pelvic sidewall lymphadenopathy. 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 No narrowing of the LEFT renal vein as it passes beneath the SMV. Reproductive structures are otherwise unremarkable. Other: Gas in the pelvis appears confined to extraperitoneal spaces. No free air in the peritoneal cavity. No signs of ascites. Near the vaginal introitus is dense area, uncertain if this is enhancing measuring 14 mm (image 53/4) Musculoskeletal: No acute musculoskeletal process. Spinal degenerative changes and osteopenia. No destructive bone findings. IMPRESSION: 1. In light of recent surgery, TURBT in the urinary bladder, the presence of  extraluminal gas and non enhancement of the bladder wall raising the question of subtle bladder injury/perforation along the LEFT posterolateral urinary bladder. No current signs of ascites. Findings are favored to be extraperitoneal though more extensive injury is difficult to exclude in the absence of a cystogram or similar evaluation with urinary bladder distension. Suggest urologic consultation with CT cystogram after consultation for further evaluation. 2. No pneumoperitoneum or ascites currently to indicate extra peritoneal injury though again lowered sensitivity on standard CT for this type of injury. 3. Long segment thickening of the colon raising the question of colitis with signs of potential enteritis. Findings are nonspecific and may be infectious in nature. Correlate with any signs of infectious colitis such as C difficile. Findings in the colon most pronounced in the sigmoid and are superimposed on extensive diverticular changes and are nonspecific. 4. Ovoid area near the vaginal introitus may represent a hemorrhagic or proteinaceous Bartholin's gland cyst and is present on the RIGHT. Correlate with physical exam. Findings are unchanged since previous imaging of October of 2020. 5. 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 Findings can be seen in the setting of pelvic congestion syndrome. 6. Aortic atherosclerosis. Aortic Atherosclerosis (ICD10-I70.0). These results were called by telephone at the time of interpretation on 06/18/2022 at 6:09 pm to [facility] who verbally acknowledged these results. Electronically Signed   By: GZetta BillsM.D.   On: 06/18/2022 18:12   DG C-Arm 1-60 Min-No Report  Result Date: 06/16/2022 Fluoroscopy was utilized by the requesting  physician.  No radiographic interpretation.      Subjective: Patient seen and examined at bedside today.  Hemodynamically stable.  Diarrhea slowed down.   Denies abdomen pain, nausea or vomiting.  Medically stable for discharge home today.  I called the daughter and discussed about the discharge plan.  Discharge Exam: Vitals:   06/21/22 2135 06/22/22 0626  BP: 122/66 135/66  Pulse: 65 71  Resp: 18 18  Temp: 97.9 F (36.6 C) 97.8 F (36.6 C)  SpO2: 98% 97%   Vitals:   06/21/22 1408 06/21/22 2135 06/22/22 0626 06/22/22 0903  BP: (!) 110/59 122/66 135/66   Pulse: 68 65 71   Resp: 18 18 18   $ Temp: 98 F (36.7 C) 97.9 F (36.6 C) 97.8 F (36.6 C)   TempSrc: Oral Oral Oral   SpO2: 98% 98% 97%   Height:    5' 4"$  (1.626 m)    General: Pt is alert, awake, not in acute distress Cardiovascular: RRR, S1/S2 +, no rubs, no gallops Respiratory: CTA bilaterally, no wheezing, no rhonchi Abdominal: Soft, NT, ND, bowel sounds + Extremities: no edema, no cyanosis    The results of significant diagnostics from this hospitalization (including imaging, microbiology, ancillary and laboratory) are listed below for reference.     Microbiology: Recent Results (from the past 240 hour(s))  C Difficile Quick Screen w PCR reflex     Status: None   Collection Time: 06/18/22  2:50 PM   Specimen: STOOL  Result Value Ref Range Status   C Diff antigen NEGATIVE NEGATIVE Final   C Diff toxin NEGATIVE NEGATIVE Final   C Diff interpretation No C. difficile detected.  Final    Comment: Performed at [facility], [place] [address], [place], [place] [address]  Urine Culture     Status: Abnormal   Collection Time: 06/18/22  3:54 PM   Specimen: Urine, Clean Catch  Result Value Ref Range Status   Specimen Description   Final    URINE, CLEAN CATCH Performed at [facility], [place] [address], [place], [place] [address]    Special Requests   Final    NONE Performed at [facility], [place] [address] [place], [place] [address]    Culture (A)  Final    <10,000 COLONIES/mL INSIGNIFICANT GROWTH Performed at  [place] [address], [place], [place] [address]    Report Status 06/19/2022 FINAL  Final  Blood culture (routine x 2)     Status: None (Preliminary result)   Collection Time: 06/18/22  5:45 PM   Specimen: BLOOD LEFT ARM  Result Value Ref Range Status   Specimen Description   Final    BLOOD LEFT ARM Performed at [facility], [place] [address] [place], [place] [address]    Special Requests   Final    BOTTLES DRAWN AEROBIC AND ANAEROBIC Blood Culture results may not be optimal due to an excessive volume of blood received in culture bottles Performed at [place] [address] [place], [place] [address]    Culture   Final    NO GROWTH 4 DAYS Performed at [facility], [place] [address], [place], [place] [address]    Report Status PENDING  Incomplete  Blood culture (routine x 2)     Status: None (Preliminary result)   Collection Time: 06/18/22  5:55 PM   Specimen: BLOOD RIGHT HAND  Result Value Ref Range Status   Specimen  Description   Final    BLOOD RIGHT HAND Performed at [facility], [place] [address] [place], [place] [address]    Special Requests   Final    BOTTLES DRAWN AEROBIC AND ANAEROBIC Blood Culture results may not be optimal due to an inadequate volume of blood received in culture bottles Performed at [place] [address], [place], [place] [address]    Culture   Final    NO GROWTH 4 DAYS Performed at [facility], [place] [address], [place], [place] [address]    Report Status PENDING  Incomplete  Gastrointestinal Panel by PCR , Stool     Status: Abnormal   Collection Time: 06/20/22  7:36 PM   Specimen: Stool  Result Value Ref Range Status   Campylobacter species NOT DETECTED NOT DETECTED Final   Plesimonas shigelloides NOT DETECTED NOT DETECTED Final   Salmonella species NOT DETECTED NOT DETECTED Final   Yersinia enterocolitica NOT DETECTED NOT DETECTED Final   Vibrio species NOT  DETECTED NOT DETECTED Final   Vibrio cholerae NOT DETECTED NOT DETECTED Final   Enteroaggregative E coli (EAEC) NOT DETECTED NOT DETECTED Final   Enteropathogenic E coli (EPEC) NOT DETECTED NOT DETECTED Final   Enterotoxigenic E coli (ETEC) NOT DETECTED NOT DETECTED Final   Shiga like toxin producing E coli (STEC) NOT DETECTED NOT DETECTED Final   Shigella/Enteroinvasive E coli (EIEC) NOT DETECTED NOT DETECTED Final   Cryptosporidium NOT DETECTED NOT DETECTED Final   Cyclospora cayetanensis NOT DETECTED NOT DETECTED Final   Entamoeba histolytica NOT DETECTED NOT DETECTED Final   Giardia lamblia NOT DETECTED NOT DETECTED Final   Adenovirus F40/41 NOT DETECTED NOT DETECTED Final   Astrovirus NOT DETECTED NOT DETECTED Final   Norovirus GI/GII NOT DETECTED NOT DETECTED Final   Rotavirus A DETECTED (A) NOT DETECTED Final   Sapovirus (I, II, IV, and V) NOT DETECTED NOT DETECTED Final    Comment: Performed at [facility], [place] [place], [place] [address]     Labs: BNP (last 3 results) No results for input(s): "BNP" in the last 8760 hours. Basic Metabolic Panel: Recent Labs  Lab 06/18/22 1541 06/20/22 1914 06/20/22 1922 06/21/22 0651 06/22/22 0614  NA 136 138  --  139 141  K 3.5 3.7  --  3.2* 3.3*  CL 103 107  --  111 109  CO2 24 20*  --  21* 22  GLUCOSE 101* 99  --  91 91  BUN 18 12  --  9 6*  CREATININE 0.69 0.97  --  0.63 0.61  CALCIUM 8.3* 8.7*  --  8.1* 8.4*  MG  --   --  2.2 2.0  --   PHOS  --   --   --  2.6  --    Liver Function Tests: Recent Labs  Lab 06/18/22 1541 06/20/22 1914 06/21/22 0651  AST 49* 44* 32  ALT 48* 45* 34  ALKPHOS 44 43 40  BILITOT 0.8 0.7 0.5  PROT 7.6 8.0 6.0*  ALBUMIN 4.0 4.1 3.1*   Recent Labs  Lab 06/18/22 1541 06/20/22 1914  LIPASE 43 33   No results for input(s): "AMMONIA" in the last 168 hours. CBC: Recent Labs  Lab 06/18/22 1541 06/20/22 1914 06/21/22 0651 06/22/22 0614  WBC 6.0 3.8* 2.4* 2.3*   NEUTROABS 5.2 2.3  --   --   HGB 13.3 13.7 11.4* 11.4*  HCT 39.8 40.6 33.1* 32.3*  MCV 93.4  92.1 92.5 90.5  PLT 215 248 179 169   Cardiac Enzymes: No results for input(s): "CKTOTAL", "CKMB", "CKMBINDEX", "TROPONINI" in the last 168 hours. BNP: Invalid input(s): "POCBNP" CBG: No results for input(s): "GLUCAP" in the last 168 hours. D-Dimer No results for input(s): "DDIMER" in the last 72 hours. Hgb A1c No results for input(s): "HGBA1C" in the last 72 hours. Lipid Profile No results for input(s): "CHOL", "HDL", "LDLCALC", "TRIG", "CHOLHDL", "LDLDIRECT" in the last 72 hours. Thyroid function studies No results for input(s): "TSH", "T4TOTAL", "T3FREE", "THYROIDAB" in the last 72 hours.  Invalid input(s): "FREET3" Anemia work up No results for input(s): "VITAMINB12", "FOLATE", "FERRITIN", "TIBC", "IRON", "RETICCTPCT" in the last 72 hours. Urinalysis    Component Value Date/Time   COLORURINE YELLOW 06/20/2022 2230   APPEARANCEUR HAZY (A) 06/20/2022 2230   LABSPEC 1.024 06/20/2022 2230   PHURINE 5.0 06/20/2022 2230   GLUCOSEU NEGATIVE 06/20/2022 2230   HGBUR MODERATE (A) 06/20/2022 2230   BILIRUBINUR NEGATIVE 06/20/2022 2230   KETONESUR 20 (A) 06/20/2022 2230   PROTEINUR 100 (A) 06/20/2022 2230   NITRITE NEGATIVE 06/20/2022 2230   LEUKOCYTESUR SMALL (A) 06/20/2022 2230   Sepsis Labs Recent Labs  Lab 06/18/22 1541 06/20/22 1914 06/21/22 0651 06/22/22 0614  WBC 6.0 3.8* 2.4* 2.3*   Microbiology Recent Results (from the past 240 hour(s))  C Difficile Quick Screen w PCR reflex     Status: None   Collection Time: 06/18/22  2:50 PM   Specimen: STOOL  Result Value Ref Range Status   C Diff antigen NEGATIVE NEGATIVE Final   C Diff toxin NEGATIVE NEGATIVE Final   C Diff interpretation No C. difficile detected.  Final    Comment: Performed at [facility], [place] [address] [place], [place] [address]  Urine Culture     Status: Abnormal   Collection Time:  06/18/22  3:54 PM   Specimen: Urine, Clean Catch  Result Value Ref Range Status   Specimen Description   Final    URINE, CLEAN CATCH Performed at [facility], [place] [address] [place], [place] [address]    Special Requests   Final    NONE Performed at [facility], [place] [address], [place], [place] [address]    Culture (A)  Final    <10,000 COLONIES/mL INSIGNIFICANT GROWTH Performed at [place] [address], [place], [place] [address]    Report Status 06/19/2022 FINAL  Final  Blood culture (routine x 2)     Status: None (Preliminary result)   Collection Time: 06/18/22  5:45 PM   Specimen: BLOOD LEFT ARM  Result Value Ref Range Status   Specimen Description   Final    BLOOD LEFT ARM Performed at [facility], [place] [address], [place], [place] [address]    Special Requests   Final    BOTTLES DRAWN AEROBIC AND ANAEROBIC Blood Culture results may not be optimal due to an excessive volume of blood received in culture bottles Performed at [place] [address] [place], [place] [address]    Culture   Final    NO GROWTH 4 DAYS Performed at [facility], [place] [address], [place],  [address]    Report Status PENDING  Incomplete  Blood culture (routine x 2)     Status: None (Preliminary result)   Collection Time: 06/18/22  5:55 PM   Specimen: BLOOD RIGHT HAND  Result Value Ref Range Status  Specimen Description   Final    BLOOD RIGHT HAND Performed at [facility], [place] [address] [place], [place] [address]    Special Requests   Final    BOTTLES DRAWN AEROBIC AND ANAEROBIC Blood Culture results may not be optimal due to an inadequate volume of blood received in culture bottles Performed at [place] [address] [place], [place] [address]    Culture   Final    NO GROWTH 4 DAYS Performed at [facility], [place] [address] [place], [place] [address]     Report Status PENDING  Incomplete  Gastrointestinal Panel by PCR , Stool     Status: Abnormal   Collection Time: 06/20/22  7:36 PM   Specimen: Stool  Result Value Ref Range Status   Campylobacter species NOT DETECTED NOT DETECTED Final   Plesimonas shigelloides NOT DETECTED NOT DETECTED Final   Salmonella species NOT DETECTED NOT DETECTED Final   Yersinia enterocolitica NOT DETECTED NOT DETECTED Final   Vibrio species NOT DETECTED NOT DETECTED Final   Vibrio cholerae NOT DETECTED NOT DETECTED Final   Enteroaggregative E coli (EAEC) NOT DETECTED NOT DETECTED Final   Enteropathogenic E coli (EPEC) NOT DETECTED NOT DETECTED Final   Enterotoxigenic E coli (ETEC) NOT DETECTED NOT DETECTED Final   Shiga like toxin producing E coli (STEC) NOT DETECTED NOT DETECTED Final   Shigella/Enteroinvasive E coli (EIEC) NOT DETECTED NOT DETECTED Final   Cryptosporidium NOT DETECTED NOT DETECTED Final   Cyclospora cayetanensis NOT DETECTED NOT DETECTED Final   Entamoeba histolytica NOT DETECTED NOT DETECTED Final   Giardia lamblia NOT DETECTED NOT DETECTED Final   Adenovirus F40/41 NOT DETECTED NOT DETECTED Final   Astrovirus NOT DETECTED NOT DETECTED Final   Norovirus GI/GII NOT DETECTED NOT DETECTED Final   Rotavirus A DETECTED (A) NOT DETECTED Final   Sapovirus (I, II, IV, and V) NOT DETECTED NOT DETECTED Final    Comment: Performed at [facility], [address], [place], [place] [address]    Please note: You were cared for by a hospitalist during your hospital stay. Once you are discharged, your primary care physician will handle any further medical issues. Please note that NO REFILLS for any discharge medications will be authorized once you are discharged, as it is imperative that you return to your primary care physician (or establish a relationship with a primary care physician if you do not have one) for your post hospital discharge needs so that they can reassess your need for  medications and monitor your lab values.    Time coordinating discharge: 40 minutes  SIGNED:   Shelly Coss, MD  [facility] 06/22/2022, 10:36 AM Pager [phone]  If 7PM-7AM, please contact night-coverage [email or web address] Password [email or web address]

## 2022-06-23 LAB — CULTURE, BLOOD (ROUTINE X 2)
Culture: NO GROWTH
Culture: NO GROWTH

## 2022-06-26 MED ORDER — ONDANSETRON 4 MG PO TBDP: 4.0000 mg | ORAL_TABLET | Freq: Three times a day (TID) | ORAL | 2 refills | Status: DC | PRN

## 2022-06-26 NOTE — Telephone Encounter (Signed)
Can call in Zofran 4 mg ODT every 8 hours as needed #20, 2 refills RG

## 2022-06-26 NOTE — Telephone Encounter (Signed)
Done

## 2022-07-01 ENCOUNTER — Ambulatory Visit: Payer: Medicare HMO | Admitting: Gastroenterology

## 2022-07-01 ENCOUNTER — Encounter: Payer: Self-pay | Admitting: Gastroenterology

## 2022-07-01 ENCOUNTER — Other Ambulatory Visit (INDEPENDENT_AMBULATORY_CARE_PROVIDER_SITE_OTHER): Payer: Medicare HMO

## 2022-07-01 VITALS — BP 118/76 | HR 75 | Ht 62.0 in | Wt 152.0 lb

## 2022-07-01 DIAGNOSIS — K449 Diaphragmatic hernia without obstruction or gangrene: Secondary | ICD-10-CM | POA: Diagnosis not present

## 2022-07-01 DIAGNOSIS — K219 Gastro-esophageal reflux disease without esophagitis: Secondary | ICD-10-CM

## 2022-07-01 DIAGNOSIS — K529 Noninfective gastroenteritis and colitis, unspecified: Secondary | ICD-10-CM | POA: Diagnosis not present

## 2022-07-01 LAB — CBC WITH DIFFERENTIAL/PLATELET
Basophils Absolute: 0.1 10*3/uL (ref 0.0–0.1)
Basophils Relative: 1.1 % (ref 0.0–3.0)
Eosinophils Absolute: 0.1 10*3/uL (ref 0.0–0.7)
Eosinophils Relative: 2 % (ref 0.0–5.0)
HCT: 38.9 % (ref 36.0–46.0)
Hemoglobin: 13.1 g/dL (ref 12.0–15.0)
Lymphocytes Relative: 34.9 % (ref 12.0–46.0)
Lymphs Abs: 1.6 10*3/uL (ref 0.7–4.0)
MCHC: 33.8 g/dL (ref 30.0–36.0)
MCV: 95.1 fl (ref 78.0–100.0)
Monocytes Absolute: 0.7 10*3/uL (ref 0.1–1.0)
Monocytes Relative: 14.8 % — ABNORMAL HIGH (ref 3.0–12.0)
Neutro Abs: 2.2 10*3/uL (ref 1.4–7.7)
Neutrophils Relative %: 47.2 % (ref 43.0–77.0)
Platelets: 385 10*3/uL (ref 150.0–400.0)
RBC: 4.09 Mil/uL (ref 3.87–5.11)
RDW: 13.5 % (ref 11.5–15.5)
WBC: 4.6 10*3/uL (ref 4.0–10.5)

## 2022-07-01 LAB — COMPREHENSIVE METABOLIC PANEL
ALT: 113 U/L — ABNORMAL HIGH (ref 0–35)
AST: 57 U/L — ABNORMAL HIGH (ref 0–37)
Albumin: 3.9 g/dL (ref 3.5–5.2)
Alkaline Phosphatase: 75 U/L (ref 39–117)
BUN: 12 mg/dL (ref 6–23)
CO2: 29 mEq/L (ref 19–32)
Calcium: 8.9 mg/dL (ref 8.4–10.5)
Chloride: 102 mEq/L (ref 96–112)
Creatinine, Ser: 0.64 mg/dL (ref 0.40–1.20)
GFR: 86.05 mL/min (ref >60.00)
Glucose, Bld: 90 mg/dL (ref 70–99)
Potassium: 4.2 mEq/L (ref 3.5–5.1)
Sodium: 138 mEq/L (ref 135–145)
Total Bilirubin: 0.5 mg/dL (ref 0.2–1.2)
Total Protein: 6.9 g/dL (ref 6.0–8.3)

## 2022-07-01 MED ORDER — PANTOPRAZOLE SODIUM 40 MG PO TBEC: 40.0000 mg | DELAYED_RELEASE_TABLET | Freq: Every day | ORAL | 3 refills | Status: DC

## 2022-07-01 NOTE — Progress Notes (Signed)
Chief Complaint: Epigastric pain  Referring Provider:  Bess Harvest*      ASSESSMENT AND PLAN;   #1.  Diarrhea d/t colitis (resolved)- Likely d/t intravesicular gemcitabine or A/Bs. Stool + Rotavirus. Neg C Diff. GI pathogens-pending. 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  Also has assoc mild leukopenia   #2. Complicated biliary history.  S/P lap chole 11/2011 with cystic stump leak s/p ERCP with biliary sphincterotomy/stent insertion 12/06/2011, s/p removal therafter. [place] 01/2018 - showing very early cirrhosis per report (doubt significance), Nl LFTs with alb 4.6 01/2018   #3. GERD with small HH. UGI with SB series 01/2018- small asymptomatic Zenker's, small hiatal hernia.  Otherwise Nl. Neg EGD 02/2018 except for small HH fundic gland polyps.  #4. Bladder Ca s/p TURBT/intravesicular gemcitibine 06/16/2022 (Dr Gloriann Loan)  Plan:  - CBC, CMP - Continue protonix 31m po qd. #30, 11 refills. - Continue bentyl 142mpo qid prn , #120. - Take zofran 71m671mDT Q8 hrs prn - RTC in 6 months.    HPI:    Kara Reynolds a [age]o. female Kara Reynolds (NP with Dr MurGeorga Boraor follow-up visit Accompanied by her daughter who is NP @hospice$   Adm to [facility] 2[facility]11 with acute GEitis after TURBT 2/5 and intravesicular gemcitabine.  Her white cell count and hemoglobin did drop thought to be due to bone marrow suppression. CT showed acute colitis. Stool + Rotavirus. Neg C Diff. 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  Treated initially with antibiotics then conservatively.  For follow-up visit.  Doing much better.  No further diarrhea.  Uses Bentyl on as needed basis.  No abdominal pain.  Has chronic nausea which is better with as needed Zofran.  No melena or hematochezia.  She denies having any vomiting.  No weight loss.  Past GI procedures: -EGD10/2019-small hiatal hernia, fundic gland polyps.  Negative HP.  03/15/2014: Small hiatal hernia, mild gastritis, neg SB Bx for celiac,  negative biopsies for HP. -History of IBS with alternating diarrhea and constipation, last colonoscopy 07/09/2016 showing pancolonic diverticulosis predominantly in the sigmoid colon.    Past Medical History:  Diagnosis Date   Acute cystitis without hematuria 07/03/2016   Anxiety 06/29/2015   Last Assessment & Plan:  Needs refill on this does not use it daily at all , but it does help her and she would like to continue with this and never fills this consistently    [facility]  [facility]   Chest discomfort 10/29/2016   Colon polyp    Encounter for screening colonoscopy 01/03/2016   GERD (gastroesophageal reflux disease) 06/29/2015   Last Assessment & Plan:  We discussed the side effects of the PPIs and use of zantac alternating if she would like, and she feels the PPI is needed for her GERD and does helps her, discussed bone loss as well as memory issues with the class as well   Hypercholesteremia 06/29/2015   Last Assessment & Plan:  Update her fasting labs for her today and discussed diet which she and her husband are working on more with her weight as well   IBS (irritable bowel syndrome)    Malaise and fatigue 07/02/2015   Last Assessment & Plan:  Off and on for her, she tries to remain active is retired now worked at [employer] and has had blood exposure and she is going to have the hepatitis C panel drawn for this purpose   Mild mitral regurgitation  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 She isnt willing.  Ordering a new dexa.  Last Assessment & Plan:  Update her vitamin d level, she is not on meds for this and had her last exam this year in februray   Other emphysema ([place]) 07/03/2016   Palpitations 10/29/2016   Paroxysmal atrial tachycardia 10/29/2016   Postmenopausal 06/29/2015   Last Assessment & Plan:  Not using any hormones, she feels she is doing  well and has her mammograms done at [place]   Vitamin D deficiency 06/29/2015   Last Assessment & Plan:  Update her level for her today and see where she is at    Past Surgical History:  Procedure Laterality Date   APPENDECTOMY     CHOLECYSTECTOMY     CYSTOSCOPY W/ URETERAL STENT PLACEMENT Bilateral 06/16/2022   Procedure: [place];  Surgeon: Lucas Mallow, MD;  Location: [facility];  Service: Urology;  Laterality: Bilateral;   left thumb surgery      TONSILLECTOMY     as a teenager   TRANSURETHRAL RESECTION OF BLADDER TUMOR  01/13/2019    Family History  Problem Relation Age of Onset   Prostate cancer Father        died about 46 years ago   Stroke Maternal Grandfather    Diabetes Child    Colon cancer Neg Hx    Esophageal cancer Neg Hx    Liver disease Neg Hx     Social History   Tobacco Use   Smoking status: Never   Smokeless tobacco: Never  Vaping Use   Vaping Use: Never used  Substance Use Topics   Alcohol use: No   Drug use: Never    Current Outpatient Medications  Medication Sig Dispense Refill   ALPRAZolam (XANAX) 0.5 MG tablet Take 0.5 mg by mouth at bedtime as needed for sleep.     dicyclomine (BENTYL) 10 MG capsule Take 1 capsule (10 mg total) by mouth 2 (two) times daily. (Patient taking differently: Take 10 mg by mouth 2 (two) times daily as needed for spasms.) 60 capsule 6   ibuprofen (ADVIL) 200 MG tablet Take 200 mg by mouth every 6 (six) hours as needed for mild pain.     loperamide (IMODIUM) 2 MG capsule Take 1 capsule (2 mg total) by mouth 4 (four) times daily as needed for diarrhea or loose stools. 12 capsule 0   metoprolol tartrate (LOPRESSOR) 25 MG tablet Take 2 tablets (50 mg total) daily as needed by mouth. (Patient taking differently: Take 25 mg by mouth 2 (two) times daily as needed (palpitations).) 30 tablet 1   ondansetron (ZOFRAN-ODT) 4 MG disintegrating tablet Take 1 tablet (4 mg total) by mouth every 8 (eight) hours  as needed for nausea or vomiting. 20 tablet 2   ondansetron (ZOFRAN-ODT) 8 MG disintegrating tablet Take 1 tablet (8 mg total) by mouth every 8 (eight) hours as needed for nausea or vomiting. 12 tablet 0   pantoprazole (PROTONIX) 40 MG tablet TAKE 1 TABLET BY MOUTH EVERY DAY (Patient taking differently: Take 40 mg by mouth daily.) 90 tablet 3   potassium chloride SA (KLOR-CON M) 20 MEQ tablet Take 2 tablets (40 mEq total) by mouth daily for 5 days. 10 tablet 0   traMADol (ULTRAM) 50 MG tablet Take 1 tablet (50 mg total) by mouth  every 6 (six) hours as needed. (Patient not taking: Reported on 07/01/2022) 8 tablet 0   No current facility-administered medications for this visit.    Allergies  Allergen Reactions   Amoxicillin-Pot Clavulanate Nausea Only   Pseudoephedrine Palpitations    Review of Systems:  Has situational anxiety      Physical Exam:    BP 118/76   Pulse 75   Ht 5' 2"$  (1.575 m)   Wt 152 lb (68.9 kg)   SpO2 98%   BMI 27.80 kg/m  Filed Weights   07/01/22 1022  Weight: 152 lb (68.9 kg)   Constitutional:  Well-developed, in no acute distress. Psychiatric: Normal mood and affect. Behavior is normal. HEENT: Pupils normal.  Conjunctivae are normal. No scleral icterus. Cardiovascular: Normal rate, regular rhythm. No edema Pulmonary/chest: Effort normal and breath sounds normal. No wheezing, rales or rhonchi. Abdominal: Soft, nondistended. Nontender. Bowel sounds active throughout. There are no masses palpable. No hepatomegaly.     Kara Austria, MD 07/01/2022, 10:37 AM  Cc: Brown-Patram, Melissa J*

## 2022-07-01 NOTE — Patient Instructions (Addendum)
_______________________________________________________  If your blood pressure at your visit was 140/90 or greater, please contact your primary care physician to follow up on this.  _______________________________________________________  If you are age 76 or older, your body mass index should be between 23-30. Your Body mass index is 27.8 kg/m. If this is out of the aforementioned range listed, please consider follow up with your Primary Care Provider.  If you are age 66 or younger, your body mass index should be between 19-25. Your Body mass index is 27.8 kg/m. If this is out of the aformentioned range listed, please consider follow up with your Primary Care Provider.   ________________________________________________________  The [facility] providers would like to encourage you to use [facility] to communicate with providers for non-urgent requests or questions.  Due to long hold times on the telephone, sending your provider a message by [facility] may be a faster and more efficient way to get a response.  Please allow 48 business hours for a response.  Please remember that this is for non-urgent requests.  _______________________________________________________   Your provider has requested that you go to the basement level for lab work before leaving today. Press "B" on the elevator. The lab is located at the first door on the left as you exit the elevator.  We have sent the following medications to your pharmacy for you to pick up at your convenience: Protonix  Please follow up in 6 months. Give [place] a call at [phone] to schedule an appointment.  Thank you,  Dr. Jackquline Denmark

## 2022-07-02 ENCOUNTER — Other Ambulatory Visit: Payer: Self-pay

## 2022-07-02 DIAGNOSIS — R748 Abnormal levels of other serum enzymes: Secondary | ICD-10-CM

## 2022-07-02 DIAGNOSIS — K760 Fatty (change of) liver, not elsewhere classified: Secondary | ICD-10-CM

## 2022-07-09 ENCOUNTER — Ambulatory Visit (HOSPITAL_COMMUNITY)
Admission: RE | Admit: 2022-07-09 | Discharge: 2022-07-09 | Disposition: A | Discharge status: Home / Self Care | Payer: Medicare HMO | Source: Ambulatory Visit | Attending: Gastroenterology | Admitting: Gastroenterology

## 2022-07-09 DIAGNOSIS — R748 Abnormal levels of other serum enzymes: Secondary | ICD-10-CM

## 2022-07-09 DIAGNOSIS — K760 Fatty (change of) liver, not elsewhere classified: Secondary | ICD-10-CM | POA: Diagnosis present

## 2022-07-14 ENCOUNTER — Other Ambulatory Visit: Payer: Self-pay

## 2022-07-14 DIAGNOSIS — K760 Fatty (change of) liver, not elsewhere classified: Secondary | ICD-10-CM

## 2022-07-14 DIAGNOSIS — R748 Abnormal levels of other serum enzymes: Secondary | ICD-10-CM

## 2022-07-28 ENCOUNTER — Telehealth: Payer: Self-pay | Admitting: Gastroenterology

## 2022-07-28 ENCOUNTER — Other Ambulatory Visit (INDEPENDENT_AMBULATORY_CARE_PROVIDER_SITE_OTHER): Payer: Medicare HMO

## 2022-07-28 DIAGNOSIS — K760 Fatty (change of) liver, not elsewhere classified: Secondary | ICD-10-CM

## 2022-07-28 DIAGNOSIS — R748 Abnormal levels of other serum enzymes: Secondary | ICD-10-CM | POA: Diagnosis not present

## 2022-07-28 LAB — HEPATIC FUNCTION PANEL
ALT: 30 U/L (ref 0–35)
AST: 24 U/L (ref 0–37)
Albumin: 3.9 g/dL (ref 3.5–5.2)
Alkaline Phosphatase: 55 U/L (ref 39–117)
Bilirubin, Direct: 0.1 mg/dL (ref 0.0–0.3)
Total Bilirubin: 0.5 mg/dL (ref 0.2–1.2)
Total Protein: 6.7 g/dL (ref 6.0–8.3)

## 2022-07-28 NOTE — Telephone Encounter (Signed)
Inbound call from patient requesting a call back to discuss if her labs results have come back and if she has a fatty liver, patient is wanting to know if there is a prescription she can take for it. Please advise.

## 2022-07-29 NOTE — Telephone Encounter (Signed)
Left message for pt to call back  °

## 2022-07-29 NOTE — Telephone Encounter (Signed)
Pt stated that she is requesting lab results. Chart reviewed. Pt had labs done yesterday. Pt notified that the labs have not been reviewed yet by the Dr and as soon as they are reviewed by the Dr we will contact her with the results and any recommendations:  Pt verbalized understanding with all questions answered.

## 2022-08-27 ENCOUNTER — Encounter: Payer: Self-pay | Admitting: Gastroenterology

## 2022-08-27 ENCOUNTER — Other Ambulatory Visit (INDEPENDENT_AMBULATORY_CARE_PROVIDER_SITE_OTHER): Payer: Medicare HMO

## 2022-08-27 ENCOUNTER — Ambulatory Visit: Payer: Medicare HMO | Admitting: Gastroenterology

## 2022-08-27 VITALS — BP 142/78 | HR 76 | Ht 63.0 in | Wt 162.1 lb

## 2022-08-27 DIAGNOSIS — K449 Diaphragmatic hernia without obstruction or gangrene: Secondary | ICD-10-CM

## 2022-08-27 DIAGNOSIS — K529 Noninfective gastroenteritis and colitis, unspecified: Secondary | ICD-10-CM

## 2022-08-27 DIAGNOSIS — Z8551 Personal history of malignant neoplasm of bladder: Secondary | ICD-10-CM | POA: Diagnosis not present

## 2022-08-27 DIAGNOSIS — K5900 Constipation, unspecified: Secondary | ICD-10-CM

## 2022-08-27 DIAGNOSIS — K219 Gastro-esophageal reflux disease without esophagitis: Secondary | ICD-10-CM

## 2022-08-27 DIAGNOSIS — R1032 Left lower quadrant pain: Secondary | ICD-10-CM

## 2022-08-27 DIAGNOSIS — R197 Diarrhea, unspecified: Secondary | ICD-10-CM

## 2022-08-27 LAB — COMPREHENSIVE METABOLIC PANEL
ALT: 27 U/L (ref 0–35)
AST: 24 U/L (ref 0–37)
Albumin: 4.2 g/dL (ref 3.5–5.2)
Alkaline Phosphatase: 54 U/L (ref 39–117)
BUN: 12 mg/dL (ref 6–23)
CO2: 29 mEq/L (ref 19–32)
Calcium: 9 mg/dL (ref 8.4–10.5)
Chloride: 103 mEq/L (ref 96–112)
Creatinine, Ser: 0.65 mg/dL (ref 0.40–1.20)
GFR: 85.63 mL/min (ref >60.00)
Glucose, Bld: 100 mg/dL — ABNORMAL HIGH (ref 70–99)
Potassium: 3.9 mEq/L (ref 3.5–5.1)
Sodium: 140 mEq/L (ref 135–145)
Total Bilirubin: 0.4 mg/dL (ref 0.2–1.2)
Total Protein: 6.8 g/dL (ref 6.0–8.3)

## 2022-08-27 LAB — CBC WITH DIFFERENTIAL/PLATELET
Basophils Absolute: 0.1 10*3/uL (ref 0.0–0.1)
Basophils Relative: 1.2 % (ref 0.0–3.0)
Eosinophils Absolute: 0.2 10*3/uL (ref 0.0–0.7)
Eosinophils Relative: 3.2 % (ref 0.0–5.0)
HCT: 38.3 % (ref 36.0–46.0)
Hemoglobin: 13.4 g/dL (ref 12.0–15.0)
Lymphocytes Relative: 50.6 % — ABNORMAL HIGH (ref 12.0–46.0)
Lymphs Abs: 3 10*3/uL (ref 0.7–4.0)
MCHC: 35.1 g/dL (ref 30.0–36.0)
MCV: 94.8 fl (ref 78.0–100.0)
Monocytes Absolute: 0.5 10*3/uL (ref 0.1–1.0)
Monocytes Relative: 8.4 % (ref 3.0–12.0)
Neutro Abs: 2.1 10*3/uL (ref 1.4–7.7)
Neutrophils Relative %: 36.6 % — ABNORMAL LOW (ref 43.0–77.0)
Platelets: 220 10*3/uL (ref 150.0–400.0)
RBC: 4.04 Mil/uL (ref 3.87–5.11)
RDW: 13.1 % (ref 11.5–15.5)
WBC: 5.9 10*3/uL (ref 4.0–10.5)

## 2022-08-27 NOTE — Patient Instructions (Signed)
_______________________________________________________  If your blood pressure at your visit was 140/90 or greater, please contact your primary care physician to follow up on this.  _______________________________________________________  If you are age 76 or older, your body mass index should be between 23-30. Your Body mass index is 28.72 kg/m. If this is out of the aforementioned range listed, please consider follow up with your Primary Care Provider.  If you are age 41 or younger, your body mass index should be between 19-25. Your Body mass index is 28.72 kg/m. If this is out of the aformentioned range listed, please consider follow up with your Primary Care Provider.   ________________________________________________________  The [facility] providers would like to encourage you to use [facility] to communicate with providers for non-urgent requests or questions.  Due to long hold times on the telephone, sending your provider a message by [facility] may be a faster and more efficient way to get a response.  Please allow 48 business hours for a response.  Please remember that this is for non-urgent requests.  _______________________________________________________  Your provider has requested that you go to the basement level for lab work before leaving today. Press "B" on the elevator. The lab is located at the first door on the left as you exit the elevator.  You have been scheduled for an appointment with Dr. Chales Abrahams on 11-27-2022 at 230pm . Please arrive 10 minutes early for your appointment.  You have been scheduled for a CT scan of the abdomen and pelvis at [facility] [place], [place] [address] 1st floor Radiology).   You are scheduled on 09-04-2022 at 5pm. You need to arrive by 245pm for your appointment time for registration. Please follow the written instructions below on the day of your exam:  WARNING: IF YOU ARE ALLERGIC TO IODINE/X-RAY DYE, PLEASE NOTIFY  RADIOLOGY IMMEDIATELY AT [phone]! YOU WILL BE GIVEN A 13 HOUR PREMEDICATION PREP.  1) Do not eat or drink anything after 1pm (4 hours prior to your test) 2) You will be given 2 bottles of oral contrast to drink. The solution may taste better if refrigerated, but do NOT add ice or any other liquid to this solution. Shake well before drinking.    Drink 1 bottle of contrast @ 3pm (2 hours prior to your exam)  Drink 1 bottle of contrast @ 4pm (1 hour prior to your exam)  You may take any medications as prescribed with a small amount of water, if necessary. If you take any of the following medications: METFORMIN, GLUCOPHAGE, GLUCOVANCE, AVANDAMET, RIOMET, FORTAMET, ACTOPLUS MET, JANUMET, GLUMETZA or METAGLIP, you MAY be asked to HOLD this medication 48 hours AFTER the exam.  The purpose of you drinking the oral contrast is to aid in the visualization of your intestinal tract. The contrast solution may cause some diarrhea. Depending on your individual set of symptoms, you may also receive an intravenous injection of x-ray contrast/dye. Plan on being at [facility] for 30 minutes or longer, depending on the type of exam you are having performed.  This test typically takes 30-45 minutes to complete.  If you have any questions regarding your exam or if you need to reschedule, you may call the CT department at [phone] between the hours of 8:00 am and 5:00 pm, Monday-Friday.  ________________________________________________________________________  Thank you,  Dr. Lynann Bologna

## 2022-08-27 NOTE — Progress Notes (Signed)
Chief Complaint: Lower abdominal pain  Referring Provider:  Rhea Bleacher*      ASSESSMENT AND PLAN;   #1.  LLQ pain with constipation. Occ rectal bleeding  Diarrhea d/t colitis (resolved)- Likely d/t intravesicular gemcitabine or A/Bs. Stool + Rotavirus. Neg C Diff. GI pathogens-pending. 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  Also has assoc mild leukopenia   #2. Complicated biliary history.  S/P lap chole 11/2011 with cystic stump leak s/p ERCP with biliary sphincterotomy/stent insertion 12/06/2011, s/p removal therafter. [place] 01/2018 - showing very early cirrhosis per report (doubt significance), Nl LFTs with alb 4.6 01/2018   #3. GERD with small HH. UGI with SB series 01/2018- small asymptomatic Zenker's, small hiatal hernia.  Otherwise Nl. Neg EGD 02/2018 except for small HH fundic gland polyps.  #4. Bladder Ca s/p TURBT/intravesicular gemcitibine 06/16/2022 (Dr Alvester Morin)  Plan:  - CBC, CMP - CT AP with contrast (r/o diverticulitis) - Continue protonix  po qd.  - If still with problems, colon for further eval - RTC in 12 weeks (July)    HPI:    Kara Reynolds is a 77 y.o. female Andrea's mom (NP with Dr Volney American) With atrial tachcardia, mild MR, HLD, palpitations (DR Judithe Modest), bladder Ca s/p TURBT. For follow-up visit Accompanied by her daughter who is NP   LLQ pain with associated back pain Diarrhea has completely resolved She had some constipation Had mucus in the stool with some blood No fever chills or night sweats.  No further weight loss.  Denies having any upper GI symptoms.  Has not used Zofran.   From previous notes:  Adm to [facility] 2/9- 2/11 with acute GEitis after TURBT 2/5 and intravesicular gemcitabine.  Her white cell count and hemoglobin did drop thought to be due to bone marrow suppression. CT showed acute colitis. Stool + Rotavirus. Neg C Diff. 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  Treated initially with antibiotics then  conservatively.  Past GI procedures: -EGD10/2019-small hiatal hernia, fundic gland polyps.  Negative HP.  03/15/2014: Small hiatal hernia, mild gastritis, neg SB Bx for celiac, negative biopsies for HP. -History of IBS with alternating diarrhea and constipation, last colonoscopy 07/09/2016 showing pancolonic diverticulosis predominantly in the sigmoid colon.    Past Medical History:  Diagnosis Date   Acute cystitis without hematuria 07/03/2016   Anxiety 06/29/2015   Last Assessment & Plan:  Needs refill on this does not use it daily at all , but it does help her and she would like to continue with this and never fills this consistently    Bladder cancer    Bladder cancer   Chest discomfort 10/29/2016   Colon polyp    Encounter for screening colonoscopy 01/03/2016   GERD (gastroesophageal reflux disease) 06/29/2015   Last Assessment & Plan:  We discussed the side effects of the PPIs and use of zantac alternating if she would like, and she feels the PPI is needed for her GERD and does helps her, discussed bone loss as well as memory issues with the class as well   Hypercholesteremia 06/29/2015   Last Assessment & Plan:  Update her fasting labs for her today and discussed diet which she and her husband are working on more with her weight as well   IBS (irritable bowel syndrome)    Malaise and fatigue 07/02/2015   Last Assessment & Plan:  Off and on for her, she tries to remain active is retired now worked at [employer] and has had  blood exposure and she is going to have the hepatitis C panel drawn for this purpose   Mild mitral regurgitation    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 She isnt willing.  Ordering a new dexa.  Last Assessment & Plan:  Update her vitamin d level, she is not on meds for this and had her last exam this year in februray   Other emphysema 07/03/2016    Palpitations 10/29/2016   Paroxysmal atrial tachycardia 10/29/2016   Postmenopausal 06/29/2015   Last Assessment & Plan:  Not using any hormones, she feels she is doing well and has her mammograms done at [place]   Vitamin D deficiency 06/29/2015   Last Assessment & Plan:  Update her level for her today and see where she is at    Past Surgical History:  Procedure Laterality Date   APPENDECTOMY     CHOLECYSTECTOMY     CYSTOSCOPY W/ URETERAL STENT PLACEMENT Bilateral 06/16/2022   Procedure: BILATERAL RETROGRADE PYELOGRAM;  Surgeon: Crista Elliot, MD;  Location: [facility];  Service: Urology;  Laterality: Bilateral;   left thumb surgery      TONSILLECTOMY     as a teenager   TRANSURETHRAL RESECTION OF BLADDER TUMOR  01/13/2019    Family History  Problem Relation Age of Onset   Prostate cancer Father        died about 15 years ago   Stroke Maternal Grandfather    Diabetes Child    Colon cancer Neg Hx    Esophageal cancer Neg Hx    Liver disease Neg Hx     Social History   Tobacco Use   Smoking status: Never   Smokeless tobacco: Never  Vaping Use   Vaping Use: Never used  Substance Use Topics   Alcohol use: No   Drug use: Never    Current Outpatient Medications  Medication Sig Dispense Refill   ALPRAZolam (XANAX) 0.5 MG tablet Take 0.5 mg by mouth at bedtime as needed for sleep.     dicyclomine (BENTYL) 10 MG capsule Take 1 capsule (10 mg total) by mouth 2 (two) times daily. (Patient taking differently: Take 10 mg by mouth 2 (two) times daily as needed for spasms.) 60 capsule 6   ibuprofen (ADVIL) 200 MG tablet Take 200 mg by mouth every 6 (six) hours as needed for mild pain.     loperamide (IMODIUM) 2 MG capsule Take 1 capsule (2 mg total) by mouth 4 (four) times daily as needed for diarrhea or loose stools. 12 capsule 0   metoprolol tartrate (LOPRESSOR) 25 MG tablet Take 2 tablets (50 mg total) daily as needed by mouth. (Patient taking differently: Take 25 mg by  mouth 2 (two) times daily as needed (palpitations).) 30 tablet 1   ondansetron (ZOFRAN-ODT) 4 MG disintegrating tablet Take 1 tablet (4 mg total) by mouth every 8 (eight) hours as needed for nausea or vomiting. 20 tablet 2   pantoprazole (PROTONIX) 40 MG tablet Take 1 tablet (40 mg total) by mouth daily. 90 tablet 3   potassium chloride SA (KLOR-CON M) 20 MEQ tablet Take 2 tablets (40 mEq total) by mouth daily for 5 days. 10 tablet 0   traMADol (ULTRAM) 50 MG tablet Take 1 tablet (50 mg total) by mouth every 6 (six) hours as needed. 8 tablet 0   No current  facility-administered medications for this visit.    Allergies  Allergen Reactions   Amoxicillin-Pot Clavulanate Nausea Only   Diphenhydramine-Phenylephrine Palpitations   Pseudoephedrine Palpitations    Review of Systems:  Has situational anxiety      Physical Exam:    BP (!) 140/80 (BP Location: Left Arm, Patient Position: Sitting, Cuff Size: Normal)   Pulse 76   Ht  (1.6 m)   Wt 162 lb 2 oz (73.5 kg)   BMI 28.72 kg/m  Filed Weights   08/27/22 1524  Weight: 162 lb 2 oz (73.5 kg)   Constitutional:  Well-developed, in no acute distress. Psychiatric: Normal mood and affect. Behavior is normal. HEENT: Pupils normal.  Conjunctivae are normal. No scleral icterus. Cardiovascular: Normal rate, regular rhythm. No edema Pulmonary/chest: Effort normal and breath sounds normal. No wheezing, rales or rhonchi. Abdominal: Soft, nondistended. LLQ mild tenderness.  Bowel sounds active throughout. There are no masses palpable. No hepatomegaly.     Edman Circle, MD 08/27/2022, 3:50 PM  Cc: Brown-Patram, Melissa J*

## 2022-09-04 ENCOUNTER — Ambulatory Visit (HOSPITAL_BASED_OUTPATIENT_CLINIC_OR_DEPARTMENT_OTHER)
Admission: RE | Admit: 2022-09-04 | Discharge: 2022-09-04 | Disposition: A | Discharge status: Home / Self Care | Payer: Medicare HMO | Source: Ambulatory Visit | Attending: Gastroenterology | Admitting: Gastroenterology

## 2022-09-04 DIAGNOSIS — K219 Gastro-esophageal reflux disease without esophagitis: Secondary | ICD-10-CM | POA: Diagnosis present

## 2022-09-04 DIAGNOSIS — K449 Diaphragmatic hernia without obstruction or gangrene: Secondary | ICD-10-CM | POA: Diagnosis present

## 2022-09-04 DIAGNOSIS — K529 Noninfective gastroenteritis and colitis, unspecified: Secondary | ICD-10-CM | POA: Diagnosis present

## 2022-09-04 DIAGNOSIS — R1032 Left lower quadrant pain: Secondary | ICD-10-CM

## 2022-09-04 DIAGNOSIS — K5900 Constipation, unspecified: Secondary | ICD-10-CM

## 2022-09-04 MED ORDER — IOHEXOL 350 MG/ML SOLN
100.0000 mL | Freq: Once | INTRAVENOUS | Status: AC | PRN
  Administered 2022-09-04: 80 mL via INTRAVENOUS

## 2022-11-27 ENCOUNTER — Ambulatory Visit: Payer: Medicare HMO | Admitting: Gastroenterology

## 2022-11-27 ENCOUNTER — Encounter: Payer: Self-pay | Admitting: Gastroenterology

## 2022-11-27 VITALS — BP 130/78 | HR 68 | Ht 65.5 in | Wt 169.0 lb

## 2022-11-27 DIAGNOSIS — K219 Gastro-esophageal reflux disease without esophagitis: Secondary | ICD-10-CM | POA: Diagnosis not present

## 2022-11-27 DIAGNOSIS — K5900 Constipation, unspecified: Secondary | ICD-10-CM

## 2022-11-27 DIAGNOSIS — K449 Diaphragmatic hernia without obstruction or gangrene: Secondary | ICD-10-CM | POA: Diagnosis not present

## 2022-11-27 MED ORDER — PANTOPRAZOLE SODIUM 40 MG PO TBEC: 40.0000 mg | DELAYED_RELEASE_TABLET | Freq: Every day | ORAL | 3 refills | Status: DC

## 2022-11-27 NOTE — Patient Instructions (Signed)
_______________________________________________________  If your blood pressure at your visit was 140/90 or greater, please contact your primary care physician to follow up on this.  _______________________________________________________  If you are age 76 or older, your body mass index should be between 23-30. Your Body mass index is 27.7 kg/m. If this is out of the aforementioned range listed, please consider follow up with your Primary Care Provider.  If you are age 57 or younger, your body mass index should be between 19-25. Your Body mass index is 27.7 kg/m. If this is out of the aformentioned range listed, please consider follow up with your Primary Care Provider.   ________________________________________________________  The  GI providers would like to encourage you to use [facility] to communicate with providers for non-urgent requests or questions.  Due to long hold times on the telephone, sending your provider a message by [facility] may be a faster and more efficient way to get a response.  Please allow 48 business hours for a response.  Please remember that this is for non-urgent requests.  _______________________________________________________  We have sent the following medications to your pharmacy for you to pick up at your convenience: Protonix  Please follow up in 12 months. Give [place] a call at [phone] to schedule an appointment.  Thank you,  Dr. Lynann Bologna

## 2022-11-27 NOTE — Progress Notes (Signed)
Chief Complaint: FU  Referring Provider:  Rhea Bleacher*      ASSESSMENT AND PLAN;   #1.  Constipation. Much better after high fiber diet  Diarrhea d/t colitis (resolved)- Likely d/t intravesicular gemcitabine or A/Bs. Stool + Rotavirus. Neg C Diff. GI pathogens-pending. 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  Also has assoc mild leukopenia.  Negative CT Abdo/pelvis 09/08/2022  #2. Complicated biliary history.  S/P lap chole 11/2011 with cystic stump leak s/p ERCP with biliary sphincterotomy/stent insertion 12/06/2011, s/p removal therafter. [place] 01/2018 - showing very early cirrhosis per report (doubt significance), Nl LFTs with alb 4.6 01/2018   #3. GERD with small HH. UGI with SB series 01/2018- small asymptomatic Zenker's, small hiatal hernia.  Otherwise Nl. Neg EGD 02/2018 except for small HH fundic gland polyps.  #4. Bladder Ca s/p TURBT/intravesicular gemcitibine 06/16/2022 (Dr Alvester Morin)  Plan:  - Continue protonix 40mg  po qd.  - Print out CT report from 09/08/22 and giver to pt - FU 1 yr.    HPI:    Kara Reynolds is a 77 y.o. female Andrea's mom (NP with Dr Volney American) With atrial tachcardia, mild MR, HLD, palpitations (DR Judithe Modest), bladder Ca s/p TURBT. For follow-up visit  Doing well from GI standpoint  No nausea, vomiting, heartburn, regurgitation, odynophagia or dysphagia.  No significant diarrhea or constipation (on high-fiber diet).  No melena or hematochezia. No unintentional weight loss. No abdominal pain.  Seen by Alona Bene for bronchitis 7/3 treated with antibiotics complicated by oral thrush requiring Diflucan.  No problems since.  Her most recent CT Abdo/pelvis from 09/08/2022 showed complete resolution of colitis and free air in vicinity of urinary bladder.  She has follow-up appointment with Dr. Alvester Morin for follow-up cystoscopy in coming days.     From previous notes:  Adm to [facility] 2/9- 2/11 with acute GEitis after TURBT 2/5 and intravesicular gemcitabine.  Her  white cell count and hemoglobin did drop thought to be due to bone marrow suppression. CT showed acute colitis. Stool + Rotavirus. Neg C Diff. 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  Treated initially with antibiotics then conservatively.  Past GI procedures: -EGD10/2019-small hiatal hernia, fundic gland polyps.  Negative HP.  03/15/2014: Small hiatal hernia, mild gastritis, neg SB Bx for celiac, negative biopsies for HP.  -History of IBS with alternating diarrhea and constipation, last colonoscopy 07/09/2016 showing pancolonic diverticulosis predominantly in the sigmoid colon.  CT AP with contrast 09/08/2022 No acute findings. Interval resolution of bladder wall thickening and extraluminal gas since previous exam. Colonic diverticulosis, without radiographic evidence of diverticulitis. Stable hepatic steatosis. Aortic Atherosclerosis (ICD10-I70.0).  Past Medical History:  Diagnosis Date   Acute cystitis without hematuria 07/03/2016   Anxiety 06/29/2015   Last Assessment & Plan:  Needs refill on this does not use it daily at all , but it does help her and she would like to continue with this and never fills this consistently    Bladder cancer [facility])    Bladder cancer   Chest discomfort 10/29/2016   Colon polyp    Encounter for screening colonoscopy 01/03/2016   GERD (gastroesophageal reflux disease) 06/29/2015   Last Assessment & Plan:  We discussed the side effects of the PPIs and use of zantac alternating if she would like, and she feels the PPI is needed for her GERD and does helps her, discussed bone loss as well as memory issues with the class as well   Hypercholesteremia 06/29/2015   Last Assessment &  Plan:  Update her fasting labs for her today and discussed diet which she and her husband are working on more with her weight as well   IBS (irritable bowel syndrome)    Malaise and fatigue 07/02/2015   Last Assessment & Plan:  Off and on for her, she tries to remain active is  retired now worked at [employer] and has had blood exposure and she is going to have the hepatitis C panel drawn for this purpose   Mild mitral regurgitation    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 She isnt willing.  Ordering a new dexa.  Last Assessment & Plan:  Update her vitamin d level, she is not on meds for this and had her last exam this year in februray   Other emphysema (HCC) 07/03/2016   Palpitations 10/29/2016   Paroxysmal atrial tachycardia 10/29/2016   Postmenopausal 06/29/2015   Last Assessment & Plan:  Not using any hormones, she feels she is doing well and has her mammograms done at [place]   Vitamin D deficiency 06/29/2015   Last Assessment & Plan:  Update her level for her today and see where she is at    Past Surgical History:  Procedure Laterality Date   APPENDECTOMY     CHOLECYSTECTOMY     CYSTOSCOPY W/ URETERAL STENT PLACEMENT Bilateral 06/16/2022   Procedure: BILATERAL RETROGRADE PYELOGRAM;  Surgeon: Crista Elliot, MD;  Location: [facility];  Service: Urology;  Laterality: Bilateral;   left thumb surgery      TONSILLECTOMY     as a teenager   TRANSURETHRAL RESECTION OF BLADDER TUMOR  01/13/2019    Family History  Problem Relation Age of Onset   Prostate cancer Father        died about 15 years ago   Stroke Maternal Grandfather    Diabetes Child    Colon cancer Neg Hx    Esophageal cancer Neg Hx    Liver disease Neg Hx     Social History   Tobacco Use   Smoking status: Never   Smokeless tobacco: Never  Vaping Use   Vaping status: Never Used  Substance Use Topics   Alcohol use: No   Drug use: Never    Current Outpatient Medications  Medication Sig Dispense Refill   ALPRAZolam (XANAX) 0.5 MG tablet Take 0.5 mg by mouth at bedtime as needed for sleep.     dicyclomine (BENTYL) 10 MG capsule Take 1 capsule (10 mg total) by  mouth 2 (two) times daily. (Patient taking differently: Take 10 mg by mouth 2 (two) times daily as needed for spasms.) 60 capsule 6   ibuprofen (ADVIL) 200 MG tablet Take 200 mg by mouth every 6 (six) hours as needed for mild pain.     metoprolol tartrate (LOPRESSOR) 25 MG tablet Take 2 tablets (50 mg total) daily as needed by mouth. (Patient taking differently: Take 25 mg by mouth 2 (two) times daily as needed (palpitations).) 30 tablet 1   pantoprazole (PROTONIX) 40 MG tablet Take 1 tablet (40 mg total) by mouth daily. 90 tablet 3   No current facility-administered medications for this visit.    Allergies  Allergen Reactions   Amoxicillin-Pot Clavulanate Nausea Only   Diphenhydramine-Phenylephrine Palpitations   Pseudoephedrine Palpitations    Review of Systems:  Has situational anxiety  Physical Exam:    BP 130/78   Pulse 68   Ht 5' 5.5" (1.664 m)   Wt 169 lb (76.7 kg)   SpO2 94%   BMI 27.70 kg/m  Filed Weights   11/27/22 1404  Weight: 169 lb (76.7 kg)   Constitutional:  Well-developed, in no acute distress. Psychiatric: Normal mood and affect. Behavior is normal. HEENT: Pupils normal.  Conjunctivae are normal. No scleral icterus. Cardiovascular: Normal rate, regular rhythm. No edema Pulmonary/chest: Effort normal and breath sounds normal. No wheezing, rales or rhonchi. Abdominal: Soft, nondistended.  Nontender bowel sounds active throughout. There are no masses palpable. No hepatomegaly.     Edman Circle, MD 11/27/2022, 2:24 PM  Cc: Brown-Patram, Melissa J*

## 2023-04-13 ENCOUNTER — Other Ambulatory Visit: Payer: Self-pay

## 2023-04-13 ENCOUNTER — Encounter (HOSPITAL_COMMUNITY): Payer: Self-pay

## 2023-04-13 ENCOUNTER — Emergency Department (HOSPITAL_COMMUNITY): Payer: Medicare HMO

## 2023-04-13 ENCOUNTER — Emergency Department (HOSPITAL_COMMUNITY)
Admission: EM | Admit: 2023-04-13 | Discharge: 2023-04-14 | Disposition: A | Discharge status: Home / Self Care | Payer: Medicare HMO | Attending: Emergency Medicine | Admitting: Emergency Medicine

## 2023-04-13 DIAGNOSIS — R1032 Left lower quadrant pain: Secondary | ICD-10-CM | POA: Diagnosis present

## 2023-04-13 DIAGNOSIS — D72829 Elevated white blood cell count, unspecified: Secondary | ICD-10-CM | POA: Diagnosis not present

## 2023-04-13 DIAGNOSIS — K5792 Diverticulitis of intestine, part unspecified, without perforation or abscess without bleeding: Secondary | ICD-10-CM

## 2023-04-13 DIAGNOSIS — K5732 Diverticulitis of large intestine without perforation or abscess without bleeding: Secondary | ICD-10-CM | POA: Diagnosis not present

## 2023-04-13 LAB — COMPREHENSIVE METABOLIC PANEL
ALT: 37 U/L (ref 0–44)
AST: 33 U/L (ref 15–41)
Albumin: 4.2 g/dL (ref 3.5–5.0)
Alkaline Phosphatase: 58 U/L (ref 38–126)
Anion gap: 8 (ref 5–15)
BUN: 20 mg/dL (ref 8–23)
CO2: 26 mmol/L (ref 22–32)
Calcium: 9.1 mg/dL (ref 8.9–10.3)
Chloride: 102 mmol/L (ref 98–111)
Creatinine, Ser: 1.09 mg/dL — ABNORMAL HIGH (ref 0.44–1.00)
GFR, Estimated: 53 mL/min — ABNORMAL LOW (ref >60)
Glucose, Bld: 110 mg/dL — ABNORMAL HIGH (ref 70–99)
Potassium: 3.5 mmol/L (ref 3.5–5.1)
Sodium: 136 mmol/L (ref 135–145)
Total Bilirubin: 0.7 mg/dL (ref <1.2)
Total Protein: 7.3 g/dL (ref 6.5–8.1)

## 2023-04-13 LAB — CBC WITH DIFFERENTIAL/PLATELET
Abs Immature Granulocytes: 0.04 10*3/uL (ref 0.00–0.07)
Basophils Absolute: 0.1 10*3/uL (ref 0.0–0.1)
Basophils Relative: 0 %
Eosinophils Absolute: 0.1 10*3/uL (ref 0.0–0.5)
Eosinophils Relative: 0 %
HCT: 39.8 % (ref 36.0–46.0)
Hemoglobin: 13.8 g/dL (ref 12.0–15.0)
Immature Granulocytes: 0 %
Lymphocytes Relative: 18 %
Lymphs Abs: 2.3 10*3/uL (ref 0.7–4.0)
MCH: 32.8 pg (ref 26.0–34.0)
MCHC: 34.7 g/dL (ref 30.0–36.0)
MCV: 94.5 fL (ref 80.0–100.0)
Monocytes Absolute: 0.8 10*3/uL (ref 0.1–1.0)
Monocytes Relative: 7 %
Neutro Abs: 9.3 10*3/uL — ABNORMAL HIGH (ref 1.7–7.7)
Neutrophils Relative %: 75 %
Platelets: 235 10*3/uL (ref 150–400)
RBC: 4.21 MIL/uL (ref 3.87–5.11)
RDW: 12 % (ref 11.5–15.5)
WBC: 12.5 10*3/uL — ABNORMAL HIGH (ref 4.0–10.5)
nRBC: 0 % (ref 0.0–0.2)

## 2023-04-13 LAB — URINALYSIS, ROUTINE W REFLEX MICROSCOPIC
Bacteria, UA: NONE SEEN
Bilirubin Urine: NEGATIVE
Glucose, UA: NEGATIVE mg/dL
Ketones, ur: NEGATIVE mg/dL
Nitrite: NEGATIVE
Protein, ur: NEGATIVE mg/dL
Specific Gravity, Urine: 1.003 — ABNORMAL LOW (ref 1.005–1.030)
pH: 6 (ref 5.0–8.0)

## 2023-04-13 LAB — LIPASE, BLOOD: Lipase: 30 U/L (ref 11–51)

## 2023-04-13 MED ORDER — IOHEXOL 300 MG/ML  SOLN
100.0000 mL | Freq: Once | INTRAMUSCULAR | Status: AC | PRN
  Administered 2023-04-13: 100 mL via INTRAVENOUS

## 2023-04-13 MED ORDER — AMOXICILLIN-POT CLAVULANATE 875-125 MG PO TABS
1.0000 | ORAL_TABLET | Freq: Once | ORAL | Status: AC
  Administered 2023-04-14: 1 via ORAL
  Filled 2023-04-13: qty 1

## 2023-04-13 MED ORDER — KETOROLAC TROMETHAMINE 30 MG/ML IJ SOLN
30.0000 mg | Freq: Once | INTRAMUSCULAR | Status: AC
  Administered 2023-04-14: 30 mg via INTRAVENOUS
  Filled 2023-04-13: qty 1

## 2023-04-13 MED ORDER — OXYCODONE-ACETAMINOPHEN 5-325 MG PO TABS
1.0000 | ORAL_TABLET | Freq: Once | ORAL | Status: AC
  Administered 2023-04-13: 1 via ORAL
  Filled 2023-04-13: qty 1

## 2023-04-13 MED ORDER — ONDANSETRON HCL 4 MG/2ML IJ SOLN
4.0000 mg | Freq: Once | INTRAMUSCULAR | Status: AC
  Administered 2023-04-14: 4 mg via INTRAVENOUS
  Filled 2023-04-13: qty 2

## 2023-04-13 MED ORDER — ALUM & MAG HYDROXIDE-SIMETH 200-200-20 MG/5ML PO SUSP
30.0000 mL | Freq: Once | ORAL | Status: AC
  Administered 2023-04-14: 30 mL via ORAL
  Filled 2023-04-13: qty 30

## 2023-04-13 NOTE — ED Triage Notes (Signed)
Lower abdominal pain that started around 1200. C/o intermittent nausea, abnormal BM the last 2 days.

## 2023-04-13 NOTE — ED Provider Triage Note (Signed)
Emergency Medicine Provider Triage Evaluation Note  MABELL DEVER , a 77 y.o. female  was evaluated in triage.  Pt complains of abdominal pain.  Review of Systems  Positive: Vomiting, chils Negative: Blood per rectum  Physical Exam  BP (!) 163/86   Pulse 78   Temp 98.6 F (37 C) (Oral)   Resp 18   Ht 5' 5.5" (1.664 m)   Wt 75.8 kg   SpO2 96%   BMI 27.37 kg/m  Gen:   Awake, no distress   Resp:  Normal effort  MSK:   Moves extremities without difficulty  Other:    Medical Decision Making  Medically screening exam initiated at 5:21 PM.  Appropriate orders placed.  MAILY EMGE was informed that the remainder of the evaluation will be completed by another provider, this initial triage assessment does not replace that evaluation, and the importance of remaining in the ED until their evaluation is complete.  Symptoms today of LLQ abdominal pain starting earlier. NP daughter says ?diverticulitis. Feels like she has to move her bowels but can't No abdominal surgeries.    Elpidio Anis, PA-C 04/13/23 1724

## 2023-04-14 MED ORDER — DICYCLOMINE HCL 20 MG PO TABS: 20.0000 mg | ORAL_TABLET | Freq: Two times a day (BID) | ORAL | 0 refills | Status: DC

## 2023-04-14 MED ORDER — ONDANSETRON 8 MG PO TBDP: ORAL_TABLET | ORAL | 0 refills | Status: DC

## 2023-04-14 MED ORDER — AMOXICILLIN-POT CLAVULANATE 875-125 MG PO TABS: 1.0000 | ORAL_TABLET | Freq: Two times a day (BID) | ORAL | 0 refills | Status: DC

## 2023-04-14 MED ORDER — ALIGN DUALBIOTIC PO CHEW: 1.0000 | CHEWABLE_TABLET | Freq: Three times a day (TID) | ORAL | 0 refills | Status: DC

## 2023-04-14 NOTE — ED Provider Notes (Signed)
[place] EMERGENCY DEPARTMENT AT [facility] Provider Note   CSN: [id number] Arrival date & time: 04/13/23  1633     History  Chief Complaint  Patient presents with   Abdominal Pain    Kara Reynolds is a 77 y.o. female.  The history is provided by the patient.  Abdominal Pain Pain location:  LLQ Pain radiates to:  Does not radiate Pain severity:  Severe Onset quality:  Gradual Timing:  Constant Progression:  Worsening Chronicity:  Recurrent Context: not eating and not laxative use   Relieved by:  Nothing Worsened by:  Nothing Ineffective treatments:  None tried Associated symptoms: constipation and nausea   Associated symptoms: no anorexia and no fever   Risk factors: no alcohol abuse        Home Medications Prior to Admission medications   Medication Sig Start Date End Date Taking? Authorizing Provider  ALPRAZolam Prudy Feeler) 0.5 MG tablet Take 0.5 mg by mouth at bedtime as needed for sleep.    [provider]  dicyclomine (BENTYL) 10 MG capsule Take 1 capsule (10 mg total) by mouth 2 (two) times daily. Patient taking differently: Take 10 mg by mouth 2 (two) times daily as needed for spasms. 02/23/18   Lynann Bologna, MD  ibuprofen (ADVIL) 200 MG tablet Take 200 mg by mouth every 6 (six) hours as needed for mild pain.    [provider]  metoprolol tartrate (LOPRESSOR) 25 MG tablet Take 2 tablets (50 mg total) daily as needed by mouth. Patient taking differently: Take 25 mg by mouth 2 (two) times daily as needed (palpitations). 03/18/17   Baldo Daub, MD  pantoprazole (PROTONIX) 40 MG tablet Take 1 tablet (40 mg total) by mouth daily. 07/01/22   Lynann Bologna, MD  pantoprazole (PROTONIX) 40 MG tablet Take 1 tablet (40 mg total) by mouth daily. 11/27/22   Lynann Bologna, MD      Allergies    Amoxicillin-pot clavulanate, Diphenhydramine-phenylephrine, and Pseudoephedrine    Review of Systems   Review of Systems  Constitutional:   Negative for fever.  HENT:  Negative for facial swelling.   Eyes:  Negative for redness.  Gastrointestinal:  Positive for abdominal pain, constipation and nausea. Negative for anorexia.  All other systems reviewed and are negative.   Physical Exam Updated Vital Signs BP (!) 141/77 (BP Location: Right Arm)   Pulse 64   Temp 98.1 F (36.7 C) (Oral)   Resp 12   Ht 5' 5.5" (1.664 m)   Wt 75.8 kg   SpO2 97%   BMI 27.37 kg/m  Physical Exam Vitals and nursing note reviewed.  Constitutional:      General: She is not in acute distress.    Appearance: Normal appearance. She is well-developed.  HENT:     Head: Normocephalic and atraumatic.     Nose: Nose normal.  Eyes:     Pupils: Pupils are equal, round, and reactive to light.  Cardiovascular:     Rate and Rhythm: Normal rate and regular rhythm.     Pulses: Normal pulses.     Heart sounds: Normal heart sounds.  Pulmonary:     Effort: Pulmonary effort is normal. No respiratory distress.     Breath sounds: Normal breath sounds.  Abdominal:     General: Bowel sounds are normal. There is no distension.     Palpations: Abdomen is soft.     Tenderness: There is no abdominal tenderness. There is no guarding or rebound.  Musculoskeletal:  General: Normal range of motion.     Cervical back: Neck supple.  Skin:    General: Skin is warm and dry.     Capillary Refill: Capillary refill takes less than 2 seconds.     Findings: No erythema or rash.  Neurological:     General: No focal deficit present.     Mental Status: She is alert and oriented to person, place, and time.     Deep Tendon Reflexes: Reflexes normal.  Psychiatric:        Mood and Affect: Mood normal.     ED Results / Procedures / Treatments   Labs (all labs ordered are listed, but only abnormal results are displayed) Results for orders placed or performed during the hospital encounter of 04/13/23  CBC with Differential  Result Value Ref Range   WBC 12.5 (H)  4.0 - 10.5 K/uL   RBC 4.21 3.87 - 5.11 MIL/uL   Hemoglobin 13.8 12.0 - 15.0 g/dL   HCT 40.9 81.1 - 91.4 %   MCV 94.5 80.0 - 100.0 fL   MCH 32.8 26.0 - 34.0 pg   MCHC 34.7 30.0 - 36.0 g/dL   RDW 78.2 95.6 - 21.3 %   Platelets 235 150 - 400 K/uL   nRBC 0.0 0.0 - 0.2 %   Neutrophils Relative % 75 %   Neutro Abs 9.3 (H) 1.7 - 7.7 K/uL   Lymphocytes Relative 18 %   Lymphs Abs 2.3 0.7 - 4.0 K/uL   Monocytes Relative 7 %   Monocytes Absolute 0.8 0.1 - 1.0 K/uL   Eosinophils Relative 0 %   Eosinophils Absolute 0.1 0.0 - 0.5 K/uL   Basophils Relative 0 %   Basophils Absolute 0.1 0.0 - 0.1 K/uL   Immature Granulocytes 0 %   Abs Immature Granulocytes 0.04 0.00 - 0.07 K/uL  Comprehensive metabolic panel  Result Value Ref Range   Sodium 136 135 - 145 mmol/L   Potassium 3.5 3.5 - 5.1 mmol/L   Chloride 102 98 - 111 mmol/L   CO2 26 22 - 32 mmol/L   Glucose, Bld 110 (H) 70 - 99 mg/dL   BUN 20 8 - 23 mg/dL   Creatinine, Ser 0.86 (H) 0.44 - 1.00 mg/dL   Calcium 9.1 8.9 - 57.8 mg/dL   Total Protein 7.3 6.5 - 8.1 g/dL   Albumin 4.2 3.5 - 5.0 g/dL   AST 33 15 - 41 U/L   ALT 37 0 - 44 U/L   Alkaline Phosphatase 58 38 - 126 U/L   Total Bilirubin 0.7 <1.2 mg/dL   GFR, Estimated 53 (L) >60 mL/min   Anion gap 8 5 - 15  Lipase, blood  Result Value Ref Range   Lipase 30 11 - 51 U/L  Urinalysis, Routine w reflex microscopic -Urine, Clean Catch  Result Value Ref Range   Color, Urine STRAW (A) YELLOW   APPearance CLEAR CLEAR   Specific Gravity, Urine 1.003 (L) 1.005 - 1.030   pH 6.0 5.0 - 8.0   Glucose, UA NEGATIVE NEGATIVE mg/dL   Hgb urine dipstick SMALL (A) NEGATIVE   Bilirubin Urine NEGATIVE NEGATIVE   Ketones, ur NEGATIVE NEGATIVE mg/dL   Protein, ur NEGATIVE NEGATIVE mg/dL   Nitrite NEGATIVE NEGATIVE   Leukocytes,Ua TRACE (A) NEGATIVE   RBC / HPF 0-5 0 - 5 RBC/hpf   WBC, UA 0-5 0 - 5 WBC/hpf   Bacteria, UA NONE SEEN NONE SEEN   Squamous Epithelial / HPF 0-5 0 - 5 /  HPF   CT ABDOMEN  PELVIS W CONTRAST  Result Date: 04/13/2023 CLINICAL DATA:  Lower abdominal pain nausea EXAM: CT ABDOMEN AND PELVIS WITH CONTRAST TECHNIQUE: Multidetector CT imaging of the abdomen and pelvis was performed using the standard protocol following bolus administration of intravenous contrast. RADIATION DOSE REDUCTION: This exam was performed according to the departmental dose-optimization program which includes automated exposure control, adjustment of the mA and/or kV according to patient size and/or use of iterative reconstruction technique. CONTRAST:  OMNIPAQUE IOHEXOL 300 MG/ML  SOLN COMPARISON:  CT 09/04/2022 FINDINGS: Lower chest: Lung bases demonstrate scarring or atelectasis at the left base and right middle lobe. Hepatobiliary: Hepatic steatosis. Cholecystectomy. No biliary dilatation Pancreas: Unremarkable. No pancreatic ductal dilatation or surrounding inflammatory changes. Spleen: Normal in size without focal abnormality. Adrenals/Urinary Tract: Adrenal glands are normal. Kidneys show no hydronephrosis. The bladder is unremarkable Stomach/Bowel: Stomach nonenlarged. No dilated small bowel. Diffuse diverticular disease of the colon. Mild wall thickening, mucosal enhancement and pericolonic soft tissue stranding involving the distal descending and sigmoid colon suspicious for diverticulitis. No perforation or abscess. Vascular/Lymphatic: Moderate aortic atherosclerosis. No aneurysm. No suspicious lymph nodes. Reproductive: Uterus unremarkable.  No adnexal mass Other: Negative for pelvic effusion or free air Musculoskeletal: No acute or suspicious osseous abnormality IMPRESSION: 1. Findings consistent with acute diverticulitis involving the distal descending and sigmoid colon. No perforation or abscess. 2. Hepatic steatosis. 3. Aortic atherosclerosis. Aortic Atherosclerosis (ICD10-I70.0). Electronically Signed   By: Jasmine Pang M.D.   On: 04/13/2023 22:42     Radiology CT ABDOMEN PELVIS W  CONTRAST  Result Date: 04/13/2023 CLINICAL DATA:  Lower abdominal pain nausea EXAM: CT ABDOMEN AND PELVIS WITH CONTRAST TECHNIQUE: Multidetector CT imaging of the abdomen and pelvis was performed using the standard protocol following bolus administration of intravenous contrast. RADIATION DOSE REDUCTION: This exam was performed according to the departmental dose-optimization program which includes automated exposure control, adjustment of the mA and/or kV according to patient size and/or use of iterative reconstruction technique. CONTRAST:  OMNIPAQUE IOHEXOL 300 MG/ML  SOLN COMPARISON:  CT 09/04/2022 FINDINGS: Lower chest: Lung bases demonstrate scarring or atelectasis at the left base and right middle lobe. Hepatobiliary: Hepatic steatosis. Cholecystectomy. No biliary dilatation Pancreas: Unremarkable. No pancreatic ductal dilatation or surrounding inflammatory changes. Spleen: Normal in size without focal abnormality. Adrenals/Urinary Tract: Adrenal glands are normal. Kidneys show no hydronephrosis. The bladder is unremarkable Stomach/Bowel: Stomach nonenlarged. No dilated small bowel. Diffuse diverticular disease of the colon. Mild wall thickening, mucosal enhancement and pericolonic soft tissue stranding involving the distal descending and sigmoid colon suspicious for diverticulitis. No perforation or abscess. Vascular/Lymphatic: Moderate aortic atherosclerosis. No aneurysm. No suspicious lymph nodes. Reproductive: Uterus unremarkable.  No adnexal mass Other: Negative for pelvic effusion or free air Musculoskeletal: No acute or suspicious osseous abnormality IMPRESSION: 1. Findings consistent with acute diverticulitis involving the distal descending and sigmoid colon. No perforation or abscess. 2. Hepatic steatosis. 3. Aortic atherosclerosis. Aortic Atherosclerosis (ICD10-I70.0). Electronically Signed   By: Jasmine Pang M.D.   On: 04/13/2023 22:42    Procedures Procedures    Medications Ordered in  ED Medications  oxyCODONE-acetaminophen (PERCOCET/ROXICET) 5-325 MG per tablet 1 tablet (1 tablet Oral Given 04/13/23 1753)  iohexol (OMNIPAQUE) 300 MG/ML solution 100 mL (100 mLs Intravenous Contrast Given 04/13/23 1924)  ondansetron (ZOFRAN) injection 4 mg (4 mg Intravenous Given 04/14/23 0000)  ketorolac (TORADOL) 30 MG/ML injection 30 mg (30 mg Intravenous Given 04/14/23 0000)  alum & mag hydroxide-simeth (MAALOX/MYLANTA) 200-200-20 MG/5ML  suspension 30 mL (30 mLs Oral Given 04/14/23 0000)  amoxicillin-clavulanate (AUGMENTIN) 875-125 MG per tablet 1 tablet (1 tablet Oral Given 04/14/23 0000)    ED Course/ Medical Decision Making/ A&P                                 Medical Decision Making Patient with LLQ pain consistent with previous diverticulitis   Amount and/or Complexity of Data Reviewed Independent Historian: spouse    Details: And daughter see above  External Data Reviewed: notes.    Details: Previous notes reviewed  Labs: ordered.    Details: White count slight elevation 12.5, normal hemoglobin 13.8, normal platelets. Normal sodium 136, normal potassium 3.5, normal creatinine. Urine is negative for UTI Radiology: ordered and independent interpretation performed.    Details: CT scan consistent with diverticulitis   Risk OTC drugs. Prescription drug management. Risk Details: Patient is well appearing.  Tolerated POs in the ED.  Stable for discharge with close follow up.  Strict return     Final Clinical Impression(s) / ED Diagnoses Final diagnoses:  Diverticulitis   Return for intractable cough, coughing up blood, fevers > 100.4 unrelieved by medication, shortness of breath, intractable vomiting, chest pain, shortness of breath, weakness, numbness, changes in speech, facial asymmetry, abdominal pain, passing out, Inability to tolerate liquids or food, cough, altered mental status or any concerns. No signs of systemic illness or infection. The patient is nontoxic-appearing on  exam and vital signs are within normal limits.  I have reviewed the triage vital signs and the nursing notes. Pertinent labs & imaging results that were available during my care of the patient were reviewed by me and considered in my medical decision making (see chart for details). After history, exam, and medical workup I feel the patient has been appropriately medically screened and is safe for discharge home. Pertinent diagnoses were discussed with the patient. Patient was given return precautions.    Rx / DC Orders ED Discharge Orders     None         Eugina Row, MD 04/14/23 1610

## 2023-04-15 ENCOUNTER — Encounter: Payer: Self-pay | Admitting: Gastroenterology

## 2023-04-15 ENCOUNTER — Ambulatory Visit: Payer: Medicare HMO | Admitting: Gastroenterology

## 2023-04-15 VITALS — BP 120/76 | HR 71 | Ht 65.5 in | Wt 161.0 lb

## 2023-04-15 DIAGNOSIS — K449 Diaphragmatic hernia without obstruction or gangrene: Secondary | ICD-10-CM

## 2023-04-15 DIAGNOSIS — K219 Gastro-esophageal reflux disease without esophagitis: Secondary | ICD-10-CM

## 2023-04-15 DIAGNOSIS — K5732 Diverticulitis of large intestine without perforation or abscess without bleeding: Secondary | ICD-10-CM | POA: Diagnosis not present

## 2023-04-15 DIAGNOSIS — R1032 Left lower quadrant pain: Secondary | ICD-10-CM

## 2023-04-15 DIAGNOSIS — K5900 Constipation, unspecified: Secondary | ICD-10-CM

## 2023-04-15 NOTE — Progress Notes (Signed)
Chief Complaint: FU  Referring Provider:  Rhea Bleacher*      ASSESSMENT AND PLAN;   #1.  LLQ pain d/t ac sigmoid diverticulitis 04/13/2023 and assoc constipation.  No abscesses.  Currently on Augmentin. Prev colon 2018 with pancolonic div as below.  #2. Complicated biliary history.  S/P lap chole 11/2011 with cystic stump leak s/p ERCP with biliary sphincterotomy/stent insertion 12/06/2011, s/p removal therafter. [place] 01/2018 - showing very early cirrhosis per report (doubt significance), Nl LFTs with alb 4.6 01/2018   #3. GERD with small HH. UGI with SB series 01/2018- small asymptomatic Zenker's, small hiatal hernia.  Otherwise Nl. Neg EGD 02/2018 except for small HH fundic gland polyps.  #4. Bladder Ca s/p TURBT/intravesicular gemcitibine 06/16/2022 (Dr Alvester Morin)  Plan: - Finish Augmentin 875mg  po BID x 7 days - If any worsening of Abdo pain/fever/chills, then repeat CT Abdo/pelvis with contrast to rule out abscess. - Low fiber diet for now. Then, high fiber diet after she is done with antibiotics. Then, start miralax 17g po every day (if constipation) - Continue protonix 40mg  po qd.  - FU 12 weeks. Will discuss regarding rpt colonoscopy at FU visit.  I left a detailed message on Kara Reynolds's cell phone.  HPI:    Kara Reynolds is a 77 y.o. female Kara Reynolds's mom (NP with Dr Volney American) With atrial tachcardia, mild MR, HLD, palpitations (DR Judithe Modest), bladder Ca s/p TURBT.  The patient, with a history of bladder cancer and diverticulosis, presented with LLQ abdominal pain and discomfort, primarily located in the left lower quadrant. The pain was associated with a recent episode of gastroesophageal reflux disease (GERD) following a meal of pizza and salad. The patient reported a significant increase in burping and discomfort post-meal, which was not relieved by Tums.  In addition to the abdominal pain, the patient experienced changes in bowel habits, including constipation and the passage of  unusual, pea-sized, rusty-colored stools. The patient expressed concern about a potential recurrence of rotavirus due to these symptoms.  The patient underwent a CT scan 12/2 which revealed inflammation in one of the diverticula, leading to a diagnosis of diverticulitis.   Given Augmentin 875 mg p.o. twice daily x 7 days and dicyclomine prn for spasms.  For follow-up visit.  She does feel better.       From previous notes:  Adm to [facility] 2/9- 2/11 with acute GEitis after TURBT 2/5 and intravesicular gemcitabine.  Her white cell count and hemoglobin did drop thought to be due to bone marrow suppression. CT showed acute colitis. Stool + Rotavirus. Neg C Diff. 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  Treated initially with antibiotics then conservatively.  Past GI procedures:  CT AP with contrast 04/13/2023 1. Findings consistent with acute diverticulitis involving the distal descending and sigmoid colon. No perforation or abscess. 2. Hepatic steatosis. 3. Aortic atherosclerosis.  -EGD10/2019-small hiatal hernia, fundic gland polyps.  Negative HP.  03/15/2014: Small hiatal hernia, mild gastritis, neg SB Bx for celiac, negative biopsies for HP.  -History of IBS with alternating diarrhea and constipation, last colonoscopy 07/09/2016 showing pancolonic diverticulosis predominantly in the sigmoid colon.  CT AP with contrast 09/08/2022 No acute findings. Interval resolution of bladder wall thickening and extraluminal gas since previous exam. Colonic diverticulosis, without radiographic evidence of diverticulitis. Stable hepatic steatosis. Aortic Atherosclerosis (ICD10-I70.0).  Past Medical History:  Diagnosis Date   Acute cystitis without hematuria 07/03/2016   Anxiety 06/29/2015   Last Assessment & Plan:  Needs refill on  this does not use it daily at all , but it does help her and she would like to continue with this and never fills this consistently    Bladder cancer [facility])     Bladder cancer   Chest discomfort 10/29/2016   Colon polyp    Diverticulitis    Encounter for screening colonoscopy 01/03/2016   GERD (gastroesophageal reflux disease) 06/29/2015   Last Assessment & Plan:  We discussed the side effects of the PPIs and use of zantac alternating if she would like, and she feels the PPI is needed for her GERD and does helps her, discussed bone loss as well as memory issues with the class as well   Hypercholesteremia 06/29/2015   Last Assessment & Plan:  Update her fasting labs for her today and discussed diet which she and her husband are working on more with her weight as well   IBS (irritable bowel syndrome)    Malaise and fatigue 07/02/2015   Last Assessment & Plan:  Off and on for her, she tries to remain active is retired now worked at [employer] and has had blood exposure and she is going to have the hepatitis C panel drawn for this purpose   Mild mitral regurgitation    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 She isnt willing.  Ordering a new dexa.  Last Assessment & Plan:  Update her vitamin d level, she is not on meds for this and had her last exam this year in februray   Other emphysema (HCC) 07/03/2016   Palpitations 10/29/2016   Paroxysmal atrial tachycardia (HCC) 10/29/2016   Postmenopausal 06/29/2015   Last Assessment & Plan:  Not using any hormones, she feels she is doing well and has her mammograms done at [place]   Vitamin D deficiency 06/29/2015   Last Assessment & Plan:  Update her level for her today and see where she is at    Past Surgical History:  Procedure Laterality Date   APPENDECTOMY     CHOLECYSTECTOMY     CYSTOSCOPY W/ URETERAL STENT PLACEMENT Bilateral 06/16/2022   Procedure: BILATERAL RETROGRADE PYELOGRAM;  Surgeon: Crista Elliot, MD;  Location: [facility];  Service: Urology;  Laterality: Bilateral;   left  thumb surgery      TONSILLECTOMY     as a teenager   TRANSURETHRAL RESECTION OF BLADDER TUMOR  01/13/2019    Family History  Problem Relation Age of Onset   Prostate cancer Father        died about 15 years ago   Stroke Maternal Grandfather    Diabetes Child    Colon cancer Neg Hx    Esophageal cancer Neg Hx    Liver disease Neg Hx     Social History   Tobacco Use   Smoking status: Never   Smokeless tobacco: Never  Vaping Use   Vaping status: Never Used  Substance Use Topics   Alcohol use: No   Drug use: Never    Current Outpatient Medications  Medication Sig Dispense Refill   ALPRAZolam (XANAX) 0.5 MG tablet Take 0.5 mg by mouth at bedtime as needed for sleep.     amoxicillin-clavulanate (AUGMENTIN) 875-125 MG tablet Take 1 tablet by mouth every 12 (twelve) hours. 14 tablet 0   dicyclomine (BENTYL) 10 MG capsule Take 1 capsule (10 mg total) by mouth 2 (  two) times daily. (Patient taking differently: Take 10 mg by mouth 2 (two) times daily as needed for spasms.) 60 capsule 6   dicyclomine (BENTYL) 20 MG tablet Take 1 tablet (20 mg total) by mouth 2 (two) times daily. 20 tablet 0   ibuprofen (ADVIL) 200 MG tablet Take 200 mg by mouth every 6 (six) hours as needed for mild pain.     metoprolol tartrate (LOPRESSOR) 25 MG tablet Take 2 tablets (50 mg total) daily as needed by mouth. (Patient taking differently: Take 25 mg by mouth 2 (two) times daily as needed (palpitations).) 30 tablet 1   ondansetron (ZOFRAN-ODT) 8 MG disintegrating tablet 8mg  ODT q8 hours prn nausea 4 tablet 0   pantoprazole (PROTONIX) 40 MG tablet Take 1 tablet (40 mg total) by mouth daily. 90 tablet 3   Probiotic Product (ALIGN DUALBIOTIC) CHEW Chew 1 capsule by mouth 4 (four) times daily - after meals and at bedtime. 120 tablet 0   No current facility-administered medications for this visit.    Allergies  Allergen Reactions   Amoxicillin-Pot Clavulanate Nausea Only   Diphenhydramine-Phenylephrine  Palpitations   Pseudoephedrine Palpitations    Review of Systems:  Has situational anxiety      Physical Exam:    BP 120/76 (BP Location: Left Arm, Patient Position: Sitting, Cuff Size: Normal)   Pulse 71   Ht 5' 5.5" (1.664 m)   Wt 161 lb (73 kg)   SpO2 97%   BMI 26.38 kg/m  Filed Weights   04/15/23 1029  Weight: 161 lb (73 kg)   Constitutional:  Well-developed, in no acute distress. Psychiatric: Normal mood and affect. Behavior is normal. HEENT: Conjunctivae are normal. No scleral icterus. Cardiovascular: Normal rate, regular rhythm. No edema Pulmonary/chest: Effort normal and breath sounds normal. No wheezing, rales or rhonchi. Abdominal: Soft, nondistended.  Minimal left lower quadrant abdominal tenderness without rebound.  Bowel sounds active throughout. There are no masses palpable. No hepatomegaly.  CT reviewed with the patient.   Edman Circle, MD 04/15/2023, 10:45 AM  Cc: Brown-Patram, Melissa J*

## 2023-04-15 NOTE — Patient Instructions (Addendum)
_______________________________________________________  If your blood pressure at your visit was 140/90 or greater, please contact your primary care physician to follow up on this.  _______________________________________________________  If you are age 76 or older, your body mass index should be between 23-30. Your Body mass index is 26.38 kg/m. If this is out of the aforementioned range listed, please consider follow up with your Primary Care Provider.  If you are age 107 or younger, your body mass index should be between 19-25. Your Body mass index is 26.38 kg/m. If this is out of the aformentioned range listed, please consider follow up with your Primary Care Provider.   ________________________________________________________  The [facility] providers would like to encourage you to use [facility] to communicate with providers for non-urgent requests or questions.  Due to long hold times on the telephone, sending your provider a message by [facility] may be a faster and more efficient way to get a response.  Please allow 48 business hours for a response.  Please remember that this is for non-urgent requests.  _______________________________________________________  Low fiber while on antibiotics and then do high fiber afterwards. If you get constipated please do miralax  You have been scheduled for an appointment with Dr. Chales Abrahams on 07-09-2023 at 8:50am . Please arrive 10 minutes early for your appointment.  Thank you,  Dr. Lynann Bologna

## 2023-07-07 ENCOUNTER — Other Ambulatory Visit: Payer: Self-pay | Admitting: Urology

## 2023-07-08 ENCOUNTER — Ambulatory Visit: Payer: Medicare HMO | Admitting: Gastroenterology

## 2023-07-09 ENCOUNTER — Encounter: Payer: Self-pay | Admitting: Gastroenterology

## 2023-07-09 ENCOUNTER — Ambulatory Visit: Payer: Medicare HMO | Admitting: Gastroenterology

## 2023-07-09 VITALS — BP 130/84 | HR 64 | Ht 63.0 in | Wt 160.5 lb

## 2023-07-09 DIAGNOSIS — K5909 Other constipation: Secondary | ICD-10-CM

## 2023-07-09 DIAGNOSIS — K219 Gastro-esophageal reflux disease without esophagitis: Secondary | ICD-10-CM

## 2023-07-09 DIAGNOSIS — K449 Diaphragmatic hernia without obstruction or gangrene: Secondary | ICD-10-CM | POA: Diagnosis not present

## 2023-07-09 DIAGNOSIS — Z8719 Personal history of other diseases of the digestive system: Secondary | ICD-10-CM

## 2023-07-09 DIAGNOSIS — C679 Malignant neoplasm of bladder, unspecified: Secondary | ICD-10-CM

## 2023-07-09 MED ORDER — PANTOPRAZOLE SODIUM 40 MG PO TBEC: 40.0000 mg | DELAYED_RELEASE_TABLET | Freq: Every day | ORAL | 4 refills | Status: DC

## 2023-07-09 NOTE — Patient Instructions (Addendum)
 _______________________________________________________  If your blood pressure at your visit was 140/90 or greater, please contact your primary care physician to follow up on this.  _______________________________________________________  If you are age 77 or older, your body mass index should be between 23-30. Your Body mass index is 28.43 kg/m. If this is out of the aforementioned range listed, please consider follow up with your Primary Care Provider.  If you are age 66 or younger, your body mass index should be between 19-25. Your Body mass index is 28.43 kg/m. If this is out of the aformentioned range listed, please consider follow up with your Primary Care Provider.   ________________________________________________________  The [facility] providers would like to encourage you to use [facility] to communicate with providers for non-urgent requests or questions.  Due to long hold times on the telephone, sending your provider a message by [facility] may be a faster and more efficient way to get a response.  Please allow 48 business hours for a response.  Please remember that this is for non-urgent requests.  _______________________________________________________  Continue protonix 40mg  daily.  Do 2 prunes a day.  You have been scheduled for an appointment with Dr. Chales Abrahams on 10-01-23 at 1120am . Please arrive 10 minutes early for your appointment.  Thank you,  Dr. Lynann Bologna

## 2023-07-09 NOTE — Progress Notes (Signed)
 Chief Complaint: FU  Referring Provider:  Rhea Bleacher*      ASSESSMENT AND PLAN;   #1.  Bladder Ca s/p TURBT/intravesicular gemcitibine 06/16/2022 (Dr Alvester Morin). Now with recurrence, awaiting rpt TURBT/chemo next week  #2. Chronic constipation. H/O sigmoid diverticulitis 04/13/2023 and assoc constipation.  No abscesses.  Prev colon 2018 with pancolonic div as below.  #3. Complicated biliary history.  S/P lap chole 11/2011 with cystic stump leak s/p ERCP with biliary sphincterotomy/stent insertion 12/06/2011, s/p removal therafter. [place] 01/2018 - showing very early cirrhosis per report (doubt significance), Nl LFTs with alb 4.6 01/2018   #4. GERD with small HH. UGI with SB series 01/2018- small asymptomatic Zenker's, small hiatal hernia.  Otherwise Nl. Neg EGD 02/2018 except for small HH fundic gland polyps.   Plan: - Continue protonix 40mg  po every day #90, 4RF - Proceed with TURBT 3/7 as planned - Prunes 2/day - RTC 3 months   Addendum-Kara Reynolds.  HPI:    Kara Reynolds is a 78 y.o. female Kara Reynolds (NP with Dr Volney American) With atrial tachcardia, mild MR, HLD, palpitations (DR Judithe Modest), bladder Ca s/p TURBT.  FU Doing good from GI standpoint  Unfortunately had recurrence of bladder CA-awaiting another TURBT  No abdominal pain.  Denies having any diarrhea.  She experiences intermittent constipation, particularly after consuming cheese, which she tries to avoid. She manages constipation by eating prunes and drinking sugar-free apple juice.  She is anxious about her upcoming bladder surgery due to a previous experience where she contracted rotavirus during surgery, leading to hospitalization. She is scheduled for blood work next Wednesday in preparation for her surgery at [facility] next Friday.       From previous notes:  Adm to [facility] 2/9- 2/11 with acute GEitis after TURBT 2/5 and intravesicular gemcitabine.  Her white cell count and hemoglobin did drop thought to be  due to bone marrow suppression. CT showed acute colitis. Stool + Rotavirus. Neg C Diff. 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  Treated initially with antibiotics then conservatively.  Past GI procedures:  CT AP with contrast 04/13/2023 1. Findings consistent with acute diverticulitis involving the distal descending and sigmoid colon. No perforation or abscess. 2. Hepatic steatosis. 3. Aortic atherosclerosis.  -EGD10/2019-small hiatal hernia, fundic gland polyps.  Negative HP.  03/15/2014: Small hiatal hernia, mild gastritis, neg SB Bx for celiac, negative biopsies for HP.  -History of IBS with alternating diarrhea and constipation, last colonoscopy 07/09/2016 showing pancolonic diverticulosis predominantly in the sigmoid colon.  CT AP with contrast 09/08/2022 No acute findings. Interval resolution of bladder wall thickening and extraluminal gas since previous exam. Colonic diverticulosis, without radiographic evidence of diverticulitis. Stable hepatic steatosis. Aortic Atherosclerosis (ICD10-I70.0).  Past Medical History:  Diagnosis Date   Acute cystitis without hematuria 07/03/2016   Anxiety 06/29/2015   Last Assessment & Plan:  Needs refill on this does not use it daily at all , but it does help her and she would like to continue with this and never fills this consistently    Bladder cancer [facility])    Bladder cancer   Chest discomfort 10/29/2016   Colon polyp    Diverticulitis    Encounter for screening colonoscopy 01/03/2016   GERD (gastroesophageal reflux disease) 06/29/2015   Last Assessment & Plan:  We discussed the side effects of the PPIs and use of zantac alternating if she would like, and she feels the PPI is needed for her GERD and does helps her, discussed  bone loss as well as memory issues with the class as well   Hypercholesteremia 06/29/2015   Last Assessment & Plan:  Update her fasting labs for her today and discussed diet which she and her husband are working on  more with her weight as well   IBS (irritable bowel syndrome)    Malaise and fatigue 07/02/2015   Last Assessment & Plan:  Off and on for her, she tries to remain active is retired now worked at [employer] and has had blood exposure and she is going to have the hepatitis C panel drawn for this purpose   Mild mitral regurgitation    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 She isnt willing.  Ordering a new dexa.  Last Assessment & Plan:  Update her vitamin d level, she is not on meds for this and had her last exam this year in februray   Other emphysema (HCC) 07/03/2016   Palpitations 10/29/2016   Paroxysmal atrial tachycardia (HCC) 10/29/2016   Postmenopausal 06/29/2015   Last Assessment & Plan:  Not using any hormones, she feels she is doing well and has her mammograms done at [place]   Vitamin D deficiency 06/29/2015   Last Assessment & Plan:  Update her level for her today and see where she is at    Past Surgical History:  Procedure Laterality Date   APPENDECTOMY     CHOLECYSTECTOMY     CYSTOSCOPY W/ URETERAL STENT PLACEMENT Bilateral 06/16/2022   Procedure: BILATERAL RETROGRADE PYELOGRAM;  Surgeon: Crista Elliot, MD;  Location: [facility];  Service: Urology;  Laterality: Bilateral;   left thumb surgery      TONSILLECTOMY     as a teenager   TRANSURETHRAL RESECTION OF BLADDER TUMOR  01/13/2019    Family History  Problem Relation Age of Onset   Prostate cancer Father        died about 15 years ago   Stroke Maternal Grandfather    Diabetes Child    Colon cancer Neg Hx    Esophageal cancer Neg Hx    Liver disease Neg Hx     Social History   Tobacco Use   Smoking status: Never   Smokeless tobacco: Never  Vaping Use   Vaping status: Never Used  Substance Use Topics   Alcohol use: No   Drug use: Never    Current Outpatient Medications  Medication Sig  Dispense Refill   acetaminophen (TYLENOL) 500 MG tablet Take 1,000 mg by mouth as needed (pain.).     ALPRAZolam (XANAX) 0.5 MG tablet Take 0.5 mg by mouth 3 (three) times daily as needed for sleep or anxiety.     dicyclomine (BENTYL) 10 MG capsule Take 1 capsule (10 mg total) by mouth 2 (two) times daily. (Patient taking differently: Take 10 mg by mouth 4 (four) times daily as needed for spasms.) 60 capsule 6   ibuprofen (ADVIL) 200 MG tablet Take 200-400 mg by mouth as needed for mild pain (pain score 1-3).     metoprolol tartrate (LOPRESSOR) 25 MG tablet Take 2 tablets (50 mg total) daily as needed by mouth. (Patient taking differently: Take 25 mg by mouth as needed (palpitations).) 30 tablet 1   pantoprazole (PROTONIX) 40 MG tablet Take 1 tablet (40 mg total) by mouth daily. 90 tablet 3   No current facility-administered medications for  this visit.    Allergies  Allergen Reactions   Amoxicillin-Pot Clavulanate Nausea Only    Patient denies this intolerance   Diphenhydramine-Phenylephrine Palpitations    Patient denies this intolerance   Pseudoephedrine Palpitations    Review of Systems:  Has situational anxiety      Physical Exam:    BP 130/84 (BP Location: Left Arm, Patient Position: Sitting, Cuff Size: Normal)   Pulse 64   Ht 5\' 3"  (1.6 m)   Wt 160 lb 8 oz (72.8 kg)   BMI 28.43 kg/m  Filed Weights   07/09/23 0859  Weight: 160 lb 8 oz (72.8 kg)   Constitutional:  Well-developed, in no acute distress. Psychiatric: Normal mood and affect. Behavior is normal. HEENT: Conjunctivae are normal. No scleral icterus. Cardiovascular: Normal rate, regular rhythm. No edema Pulmonary/chest: Effort normal and breath sounds normal. No wheezing, rales or rhonchi. Abdominal: Soft, nondistended.  Minimal left lower quadrant abdominal tenderness without rebound.  Bowel sounds active throughout. There are no masses palpable. No hepatomegaly.  CT reviewed with the patient.   Edman Circle,  MD 07/09/2023, 9:19 AM  Cc: Brown-Patram, Melissa J*

## 2023-07-10 NOTE — Patient Instructions (Signed)
 SURGICAL WAITING ROOM VISITATION  Patients having surgery or a procedure may have no more than 2 support people in the waiting area - these visitors may rotate.    Children under the age of 51 must have an adult with them who is not the patient.  Due to an increase in RSV and influenza rates and associated hospitalizations, children ages 29 and under may not visit patients in [facility] hospitals.  Visitors with respiratory illnesses are discouraged from visiting and should remain at home.  If the patient needs to stay at the hospital during part of their recovery, the visitor guidelines for inpatient rooms apply. Pre-op nurse will coordinate an appropriate time for 1 support person to accompany patient in pre-op.  This support person may not rotate.    Please refer to the [facility] website for the visitor guidelines for Inpatients (after your surgery is over and you are in a regular room).       Your procedure is scheduled on: 07-17-23   Report to [facility] Main Entrance    Report to admitting at      11:00 AM   Call this number if you have problems the morning of surgery [phone]   Do not eat food :After Midnight.   After Midnight you may have the following liquids until _0700 _____ AM/ DAY OF SURGERY  then nothing by mouht  Water Non-Citrus Juices (without pulp, NO RED-Apple, White grape, White cranberry) Black Coffee (NO MILK/CREAM OR CREAMERS, sugar ok)  Clear Tea (NO MILK/CREAM OR CREAMERS, sugar ok) regular and decaf                             Plain Jell-O (NO RED)                                           Fruit ices (not with fruit pulp, NO RED)                                     Popsicles (NO RED)                                                               Sports drinks like Gatorade (NO RED)                            If you have questions, please contact your surgeon's office.   FOLLOW  ANY ADDITIONAL PRE OP INSTRUCTIONS YOU RECEIVED FROM  YOUR SURGEON'S OFFICE!!!     Oral Hygiene is also important to reduce your risk of infection.                                    Remember - BRUSH YOUR TEETH THE MORNING OF SURGERY WITH YOUR REGULAR TOOTHPASTE  DENTURES WILL BE REMOVED PRIOR TO SURGERY PLEASE DO NOT APPLY "Poly grip" OR ADHESIVES!!!   Do NOT smoke after Midnight  Stop all vitamins and herbal supplements 7 days before surgery.   Take these medicines the morning of surgery with A SIP OF WATER: pantoprazole, metoprolol, xanax, tylenol if needed  DO NOT TAKE ANY ORAL DIABETIC MEDICATIONS DAY OF YOUR SURGERY  Bring CPAP mask and tubing day of surgery.                              You may not have any metal on your body including hair pins, jewelry, and body piercing             Do not wear make-up, lotions, powders, perfumes/cologne, or deodorant  Do not wear nail polish including gel and S&S, artificial/acrylic nails, or any other type of covering on natural nails including finger and toenails. If you have artificial nails, gel coating, etc. that needs to be removed by a nail salon please have this removed prior to surgery or surgery may need to be canceled/ delayed if the surgeon/ anesthesia feels like they are unable to be safely monitored.   Do not shave  48 hours prior to surgery.              Do not bring valuables to the hospital. [facility] IS NOT             RESPONSIBLE   FOR VALUABLES.   Contacts, glasses, dentures or bridgework may not be worn into surgery.   Bring small overnight bag day of surgery.   DO NOT BRING YOUR HOME MEDICATIONS TO THE HOSPITAL. PHARMACY WILL DISPENSE MEDICATIONS LISTED ON YOUR MEDICATION LIST TO YOU DURING YOUR ADMISSION IN THE HOSPITAL!    Patients discharged on the day of surgery will not be allowed to drive home.  Someone NEEDS to stay with you for the first 24 hours after anesthesia.   Special Instructions: Bring a copy of your healthcare power of attorney and living will  documents the day of surgery if you haven't scanned them before.              Please read over the following fact sheets you were given: IF YOU HAVE QUESTIONS ABOUT YOUR PRE-OP INSTRUCTIONS PLEASE CALL [phone]   If you received a COVID test during your pre-op visit  it is requested that you wear a mask when out in public, stay away from anyone that may not be feeling well and notify your surgeon if you develop symptoms. If you test positive for Covid or have been in contact with anyone that has tested positive in the last 10 days please notify you surgeon.    [facility] - Preparing for Surgery Before surgery, you can play an important role.  Because skin is not sterile, your skin needs to be as free of germs as possible.  You can reduce the number of germs on your skin by washing with CHG (chlorahexidine gluconate) soap before surgery.  CHG is an antiseptic cleaner which kills germs and bonds with the skin to continue killing germs even after washing. Please DO NOT use if you have an allergy to CHG or antibacterial soaps.  If your skin becomes reddened/irritated stop using the CHG and inform your nurse when you arrive at Short Stay. Do not shave (including legs and underarms) for at least 48 hours prior to the first CHG shower.  You may shave your face/neck. Please follow these instructions carefully:  1.  Shower with CHG Soap the night before surgery  and the  morning of Surgery.  2.  If you choose to wash your hair, wash your hair first as usual with your  normal  shampoo.  3.  After you shampoo, rinse your hair and body thoroughly to remove the  shampoo.                           4.  Use CHG as you would any other liquid soap.  You can apply chg directly  to the skin and wash                       Gently with a scrungie or clean washcloth.  5.  Apply the CHG Soap to your body ONLY FROM THE NECK DOWN.   Do not use on face/ open                           Wound or open sores. Avoid contact with  eyes, ears mouth and genitals (private parts).                       Wash face,  Genitals (private parts) with your normal soap.             6.  Wash thoroughly, paying special attention to the area where your surgery  will be performed.  7.  Thoroughly rinse your body with warm water from the neck down.  8.  DO NOT shower/wash with your normal soap after using and rinsing off  the CHG Soap.                9.  Pat yourself dry with a clean towel.            10.  Wear clean pajamas.            11.  Place clean sheets on your bed the night of your first shower and do not  sleep with pets. Day of Surgery : Do not apply any lotions/deodorants the morning of surgery.  Please wear clean clothes to the hospital/surgery center.  FAILURE TO FOLLOW THESE INSTRUCTIONS MAY RESULT IN THE CANCELLATION OF YOUR SURGERY PATIENT SIGNATURE_________________________________  NURSE SIGNATURE__________________________________  ________________________________________________________________________

## 2023-07-10 NOTE — Progress Notes (Addendum)
 PCP - Richardson Dopp ,NP Cardiologist - Dr. Vernona Rieger , MD  LOV 02-18-23  PPM/ICD -  Device Orders -  Rep Notified -   Chest x-ray -  EKG - 07-15-23  epic Stress Test -  ECHO - 2022 [facility] Cardiac Cath -   Sleep Study -  CPAP -   Fasting Blood Sugar -  Checks Blood Sugar _____ times a day  Blood Thinner Instructions: Aspirin Instructions: N/A  ERAS Protcol - PRE-SURGERY    COVID vaccine -yes  Activity--Able to climb a flight of stairs without CP or SOB Anesthesia review: EKG, COPD, Mild mitral regurgitation  Patient denies shortness of breath, fever, cough and chest pain at PAT appointment   All instructions explained to the patient, with a verbal understanding of the material. Patient agrees to go over the instructions while at home for a better understanding. Patient also instructed to self quarantine after being tested for COVID-19. The opportunity to ask questions was provided.

## 2023-07-15 ENCOUNTER — Other Ambulatory Visit: Payer: Self-pay

## 2023-07-15 ENCOUNTER — Encounter (HOSPITAL_COMMUNITY)
Admission: RE | Admit: 2023-07-15 | Discharge: 2023-07-15 | Disposition: A | Discharge status: Home / Self Care | Payer: Medicare Other | Source: Ambulatory Visit | Attending: Urology | Admitting: Urology

## 2023-07-15 ENCOUNTER — Encounter (HOSPITAL_COMMUNITY): Payer: Self-pay

## 2023-07-15 VITALS — BP 162/81 | HR 77 | Temp 98.7°F | Resp 16 | Ht 64.5 in | Wt 160.0 lb

## 2023-07-15 DIAGNOSIS — Z01818 Encounter for other preprocedural examination: Secondary | ICD-10-CM | POA: Diagnosis present

## 2023-07-15 DIAGNOSIS — K219 Gastro-esophageal reflux disease without esophagitis: Secondary | ICD-10-CM | POA: Diagnosis not present

## 2023-07-15 DIAGNOSIS — C679 Malignant neoplasm of bladder, unspecified: Secondary | ICD-10-CM | POA: Diagnosis not present

## 2023-07-15 DIAGNOSIS — E7800 Pure hypercholesterolemia, unspecified: Secondary | ICD-10-CM | POA: Diagnosis not present

## 2023-07-15 DIAGNOSIS — I4719 Other supraventricular tachycardia: Secondary | ICD-10-CM | POA: Diagnosis not present

## 2023-07-15 DIAGNOSIS — R002 Palpitations: Secondary | ICD-10-CM | POA: Diagnosis not present

## 2023-07-15 DIAGNOSIS — I081 Rheumatic disorders of both mitral and tricuspid valves: Secondary | ICD-10-CM | POA: Diagnosis not present

## 2023-07-15 DIAGNOSIS — J439 Emphysema, unspecified: Secondary | ICD-10-CM | POA: Insufficient documentation

## 2023-07-15 HISTORY — DX: Personal history of other diseases of the digestive system: Z87.19

## 2023-07-15 LAB — CBC
HCT: 43.1 % (ref 36.0–46.0)
Hemoglobin: 14.2 g/dL (ref 12.0–15.0)
MCH: 31.2 pg (ref 26.0–34.0)
MCHC: 32.9 g/dL (ref 30.0–36.0)
MCV: 94.7 fL (ref 80.0–100.0)
Platelets: 254 10*3/uL (ref 150–400)
RBC: 4.55 MIL/uL (ref 3.87–5.11)
RDW: 12.1 % (ref 11.5–15.5)
WBC: 5.5 10*3/uL (ref 4.0–10.5)
nRBC: 0 % (ref 0.0–0.2)

## 2023-07-15 LAB — BASIC METABOLIC PANEL
Anion gap: 9 (ref 5–15)
BUN: 14 mg/dL (ref 8–23)
CO2: 26 mmol/L (ref 22–32)
Calcium: 9.5 mg/dL (ref 8.9–10.3)
Chloride: 104 mmol/L (ref 98–111)
Creatinine, Ser: 0.53 mg/dL (ref 0.44–1.00)
GFR, Estimated: >60 mL/min (ref >60)
Glucose, Bld: 98 mg/dL (ref 70–99)
Potassium: 4 mmol/L (ref 3.5–5.1)
Sodium: 139 mmol/L (ref 135–145)

## 2023-07-16 NOTE — Progress Notes (Signed)
 Anesthesia Chart Review   Case: [id number] Date/Time: 07/17/23 1300   Procedure: TURBT, WITH CHEMOTHERAPEUTIC AGENT INSTILLATION INTO BLADDER - 45 MINUTE CASE   Anesthesia type: General   Pre-op diagnosis: BLADDER TUMOR   Location: [facility] ROOM 06 / [facility]   Surgeons: Crista Elliot, MD       DISCUSSION:78 y.o. never smoker with h/o GERD, emphysema, bladder cancer scheduled for above procedure 07/17/2023 with Dr. Modena Slater.   Pt follows with cardiology for mild mitral regurgitation, paroxysmal atrial tachycardia, hypercholesteremia.  Pt last seen 02/18/2023.  Per OV note pt doing well, palpitations have decreased.  Echo 2023 with mild mitral regurgitation.   Echo 10/30/2021 (Care Everywhere) SUMMARY  No significant change when compared to previous study 10-24-2020.  The left ventricular size is normal.  There is normal left ventricular wall thickness.  LV ejection fraction = 60-65%.  Left ventricular systolic function is normal.  There is mild mitral regurgitation.  There is mild tricuspid regurgitation.  IVC size was mildly dilated.  VS: BP (!) 162/81   Pulse 77   Temp 37.1 C (Oral)   Resp 16   Ht 5' 4.5" (1.638 m)   Wt 72.6 kg   SpO2 98%   BMI 27.04 kg/m   PROVIDERS: Brown-Patram, Servando Salina, NP is PCP    LABS: Labs reviewed: Acceptable for surgery. (all labs ordered are listed, but only abnormal results are displayed)  Labs Reviewed  BASIC METABOLIC PANEL  CBC     IMAGES:   EKG:   CV:  Past Medical History:  Diagnosis Date   Acute cystitis without hematuria 07/03/2016   Anxiety 06/29/2015   Last Assessment & Plan:  Needs refill on this does not use it daily at all , but it does help her and she would like to continue with this and never fills this consistently    Bladder cancer [facility])    Bladder cancer   Chest discomfort 10/29/2016   Colon polyp    Diverticulitis    Encounter for screening colonoscopy 01/03/2016   GERD (gastroesophageal reflux  disease) 06/29/2015   Last Assessment & Plan:  We discussed the side effects of the PPIs and use of zantac alternating if she would like, and she feels the PPI is needed for her GERD and does helps her, discussed bone loss as well as memory issues with the class as well   History of hiatal hernia    Hypercholesteremia 06/29/2015   Last Assessment & Plan:  Update her fasting labs for her today and discussed diet which she and her husband are working on more with her weight as well   IBS (irritable bowel syndrome)    Malaise and fatigue 07/02/2015   Last Assessment & Plan:  Off and on for her, she tries to remain active is retired now worked at [employer] and has had blood exposure and she is going to have the hepatitis C panel drawn for this purpose   Mild mitral regurgitation    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 She isnt willing.  Ordering a new dexa.  Last Assessment & Plan:  Update her vitamin d level, she is not on meds for this and had her last exam this year in februray   Other emphysema (HCC) 07/03/2016   Palpitations 10/29/2016   Takes metoprolol  PRN   Paroxysmal  atrial tachycardia (HCC) 10/29/2016   Postmenopausal 06/29/2015   Last Assessment & Plan:  Not using any hormones, she feels she is doing well and has her mammograms done at [place]   Vitamin D deficiency 06/29/2015   Last Assessment & Plan:  Update her level for her today and see where she is at    Past Surgical History:  Procedure Laterality Date   APPENDECTOMY     teenager   CHOLECYSTECTOMY     CYSTOSCOPY W/ URETERAL STENT PLACEMENT Bilateral 06/16/2022   Procedure: BILATERAL RETROGRADE PYELOGRAM;  Surgeon: Crista Elliot, MD;  Location: [facility];  Service: Urology;  Laterality: Bilateral;   left thumb surgery      TONSILLECTOMY     early 20's   TRANSURETHRAL RESECTION OF BLADDER TUMOR   01/13/2019    MEDICATIONS:  acetaminophen (TYLENOL) 500 MG tablet   ALPRAZolam (XANAX) 0.5 MG tablet   dicyclomine (BENTYL) 10 MG capsule   ibuprofen (ADVIL) 200 MG tablet   metoprolol tartrate (LOPRESSOR) 25 MG tablet   pantoprazole (PROTONIX) 40 MG tablet   No current facility-administered medications for this encounter.     Jodell Cipro Ward, PA-C [facility] Pre-Surgical Testing [phone]

## 2023-07-17 ENCOUNTER — Ambulatory Visit (HOSPITAL_COMMUNITY)

## 2023-07-17 ENCOUNTER — Ambulatory Visit (HOSPITAL_BASED_OUTPATIENT_CLINIC_OR_DEPARTMENT_OTHER): Admitting: Anesthesiology

## 2023-07-17 ENCOUNTER — Other Ambulatory Visit: Payer: Self-pay

## 2023-07-17 ENCOUNTER — Encounter (HOSPITAL_COMMUNITY): Payer: Self-pay | Admitting: Urology

## 2023-07-17 ENCOUNTER — Ambulatory Visit (HOSPITAL_COMMUNITY)
Admission: RE | Admit: 2023-07-17 | Discharge: 2023-07-17 | Disposition: A | Discharge status: Home / Self Care | Payer: Medicare HMO | Source: Ambulatory Visit | Attending: Urology | Admitting: Urology

## 2023-07-17 ENCOUNTER — Encounter (HOSPITAL_COMMUNITY)
Admission: RE | Disposition: A | Discharge status: Home / Self Care | Payer: Self-pay | Source: Ambulatory Visit | Attending: Urology

## 2023-07-17 ENCOUNTER — Ambulatory Visit (HOSPITAL_COMMUNITY): Payer: Self-pay | Admitting: Physician Assistant

## 2023-07-17 DIAGNOSIS — I4719 Other supraventricular tachycardia: Secondary | ICD-10-CM | POA: Insufficient documentation

## 2023-07-17 DIAGNOSIS — K219 Gastro-esophageal reflux disease without esophagitis: Secondary | ICD-10-CM | POA: Insufficient documentation

## 2023-07-17 DIAGNOSIS — Z79899 Other long term (current) drug therapy: Secondary | ICD-10-CM | POA: Insufficient documentation

## 2023-07-17 DIAGNOSIS — D303 Benign neoplasm of bladder: Secondary | ICD-10-CM | POA: Insufficient documentation

## 2023-07-17 DIAGNOSIS — R011 Cardiac murmur, unspecified: Secondary | ICD-10-CM | POA: Diagnosis not present

## 2023-07-17 DIAGNOSIS — I10 Essential (primary) hypertension: Secondary | ICD-10-CM

## 2023-07-17 DIAGNOSIS — F419 Anxiety disorder, unspecified: Secondary | ICD-10-CM | POA: Insufficient documentation

## 2023-07-17 DIAGNOSIS — Z8551 Personal history of malignant neoplasm of bladder: Secondary | ICD-10-CM | POA: Insufficient documentation

## 2023-07-17 DIAGNOSIS — K449 Diaphragmatic hernia without obstruction or gangrene: Secondary | ICD-10-CM | POA: Insufficient documentation

## 2023-07-17 DIAGNOSIS — I081 Rheumatic disorders of both mitral and tricuspid valves: Secondary | ICD-10-CM | POA: Diagnosis not present

## 2023-07-17 DIAGNOSIS — J449 Chronic obstructive pulmonary disease, unspecified: Secondary | ICD-10-CM | POA: Diagnosis not present

## 2023-07-17 DIAGNOSIS — D494 Neoplasm of unspecified behavior of bladder: Secondary | ICD-10-CM | POA: Diagnosis present

## 2023-07-17 DIAGNOSIS — Z8249 Family history of ischemic heart disease and other diseases of the circulatory system: Secondary | ICD-10-CM | POA: Insufficient documentation

## 2023-07-17 SURGERY — TURBT, WITH CHEMOTHERAPEUTIC AGENT INSTILLATION INTO BLADDER
Anesthesia: General | Site: Bladder

## 2023-07-17 MED ORDER — ORAL CARE MOUTH RINSE: 15.0000 mL | Freq: Once | OROMUCOSAL | Status: AC

## 2023-07-17 MED ORDER — CEFAZOLIN SODIUM-DEXTROSE 2-4 GM/100ML-% IV SOLN
2.0000 g | INTRAVENOUS | Status: AC
  Administered 2023-07-17: 2 g via INTRAVENOUS
  Filled 2023-07-17: qty 100

## 2023-07-17 MED ORDER — ONDANSETRON HCL 4 MG/2ML IJ SOLN: 4.0000 mg | Freq: Once | INTRAMUSCULAR | Status: DC | PRN

## 2023-07-17 MED ORDER — LIDOCAINE 2% (20 MG/ML) 5 ML SYRINGE
INTRAMUSCULAR | Status: DC | PRN
  Administered 2023-07-17: 60 mg via INTRAVENOUS

## 2023-07-17 MED ORDER — OXYCODONE HCL 5 MG PO TABS: 5.0000 mg | ORAL_TABLET | Freq: Once | ORAL | Status: DC | PRN

## 2023-07-17 MED ORDER — ROCURONIUM BROMIDE 10 MG/ML (PF) SYRINGE
PREFILLED_SYRINGE | INTRAVENOUS | Status: DC | PRN
  Administered 2023-07-17: 50 mg via INTRAVENOUS

## 2023-07-17 MED ORDER — IOHEXOL 300 MG/ML  SOLN
INTRAMUSCULAR | Status: DC | PRN
  Administered 2023-07-17: 14 mL

## 2023-07-17 MED ORDER — AMISULPRIDE (ANTIEMETIC) 5 MG/2ML IV SOLN: 10.0000 mg | Freq: Once | INTRAVENOUS | Status: DC | PRN

## 2023-07-17 MED ORDER — STERILE WATER FOR IRRIGATION IR SOLN
Status: DC | PRN
  Administered 2023-07-17: 500 mL

## 2023-07-17 MED ORDER — LACTATED RINGERS IV SOLN: INTRAVENOUS | Status: DC | PRN

## 2023-07-17 MED ORDER — FENTANYL CITRATE (PF) 100 MCG/2ML IJ SOLN
INTRAMUSCULAR | Status: AC
  Filled 2023-07-17: qty 2

## 2023-07-17 MED ORDER — PHENYLEPHRINE 80 MCG/ML (10ML) SYRINGE FOR IV PUSH (FOR BLOOD PRESSURE SUPPORT)
PREFILLED_SYRINGE | INTRAVENOUS | Status: AC
  Filled 2023-07-17: qty 10

## 2023-07-17 MED ORDER — SODIUM CHLORIDE 0.9 % IR SOLN
Status: DC | PRN
  Administered 2023-07-17: 6000 mL

## 2023-07-17 MED ORDER — ONDANSETRON HCL 4 MG/2ML IJ SOLN
INTRAMUSCULAR | Status: DC | PRN
Start: 2023-07-17 — End: 2023-07-17
  Administered 2023-07-17: 4 mg via INTRAVENOUS

## 2023-07-17 MED ORDER — CHLORHEXIDINE GLUCONATE 0.12 % MT SOLN
15.0000 mL | Freq: Once | OROMUCOSAL | Status: AC
  Administered 2023-07-17: 15 mL via OROMUCOSAL

## 2023-07-17 MED ORDER — PHENYLEPHRINE HCL (PRESSORS) 10 MG/ML IV SOLN
INTRAVENOUS | Status: DC | PRN
  Administered 2023-07-17: 80 ug via INTRAVENOUS

## 2023-07-17 MED ORDER — ACETAMINOPHEN 500 MG PO TABS
1000.0000 mg | ORAL_TABLET | Freq: Once | ORAL | Status: AC
  Administered 2023-07-17: 1000 mg via ORAL
  Filled 2023-07-17: qty 2

## 2023-07-17 MED ORDER — OXYCODONE HCL 5 MG/5ML PO SOLN
5.0000 mg | Freq: Once | ORAL | Status: DC | PRN
Start: 2023-07-17 — End: 2023-07-17

## 2023-07-17 MED ORDER — ROCURONIUM BROMIDE 10 MG/ML (PF) SYRINGE
PREFILLED_SYRINGE | INTRAVENOUS | Status: AC
  Filled 2023-07-17: qty 20

## 2023-07-17 MED ORDER — DEXAMETHASONE SODIUM PHOSPHATE 10 MG/ML IJ SOLN
INTRAMUSCULAR | Status: DC | PRN
Start: 2023-07-17 — End: 2023-07-17
  Administered 2023-07-17: 10 mg via INTRAVENOUS

## 2023-07-17 MED ORDER — FENTANYL CITRATE PF 50 MCG/ML IJ SOSY: 25.0000 ug | PREFILLED_SYRINGE | INTRAMUSCULAR | Status: DC | PRN

## 2023-07-17 MED ORDER — PROPOFOL 10 MG/ML IV BOLUS
INTRAVENOUS | Status: DC | PRN
  Administered 2023-07-17: 100 mg via INTRAVENOUS
  Administered 2023-07-17: 40 mg via INTRAVENOUS

## 2023-07-17 MED ORDER — GEMCITABINE CHEMO FOR BLADDER INSTILLATION 2000 MG
2000.0000 mg | Freq: Once | INTRAVENOUS | Status: AC
  Administered 2023-07-17: 2000 mg via INTRAVESICAL
  Filled 2023-07-17: qty 2000

## 2023-07-17 MED ORDER — FENTANYL CITRATE (PF) 100 MCG/2ML IJ SOLN
INTRAMUSCULAR | Status: DC | PRN
Start: 2023-07-17 — End: 2023-07-17
  Administered 2023-07-17: 25 ug via INTRAVENOUS

## 2023-07-17 MED ORDER — SUGAMMADEX SODIUM 200 MG/2ML IV SOLN
INTRAVENOUS | Status: DC | PRN
  Administered 2023-07-17: 200 mg via INTRAVENOUS

## 2023-07-17 SURGICAL SUPPLY — 16 items
BAG URINE DRAIN 2000ML AR STRL (UROLOGICAL SUPPLIES) IMPLANT
BAG URO CATCHER STRL LF (MISCELLANEOUS) ×1 IMPLANT
CATH FOLEY 2WAY SLVR 5CC 18FR (CATHETERS) IMPLANT
DRAPE FOOT SWITCH (DRAPES) ×1 IMPLANT
ELECT REM PT RETURN 15FT ADLT (MISCELLANEOUS) ×1 IMPLANT
GLOVE BIO SURGEON STRL SZ7.5 (GLOVE) ×1 IMPLANT
GOWN STRL REUS W/ TWL XL LVL3 (GOWN DISPOSABLE) ×1 IMPLANT
KIT TURNOVER KIT A (KITS) IMPLANT
LOOP CUT BIPOLAR 24F LRG (ELECTROSURGICAL) IMPLANT
MANIFOLD NEPTUNE II (INSTRUMENTS) ×1 IMPLANT
PACK CYSTO (CUSTOM PROCEDURE TRAY) ×1 IMPLANT
PLUG CATH AND CAP STRL 200 (CATHETERS) IMPLANT
SYR TOOMEY IRRIG 70ML (MISCELLANEOUS) IMPLANT
SYRINGE TOOMEY IRRIG 70ML (MISCELLANEOUS) IMPLANT
TUBING CONNECTING 10 (TUBING) ×1 IMPLANT
TUBING UROLOGY SET (TUBING) ×1 IMPLANT

## 2023-07-17 NOTE — Op Note (Signed)
 Operative Note  Preoperative diagnosis:  1.  Bladder tumor  Postoperative diagnosis: 1.  Bladder tumor-a small  Procedure(s): 1.  Transurethral resection of bladder tumor--small, 1 cm 2.  Bilateral retrograde pyelogram 3.  Intravesical instillation of gemcitabine  Surgeon: Modena Slater, MD  Assistants: None  Anesthesia: General  Complications: None immediate  EBL: Minimal  Specimens: 1.  Bladder tumor  Drains/Catheters: 1.  18 French Foley catheter  Intraoperative findings: 1.  Normal urethra.  She had approxi-1 cm superficial appearing papillary bladder tumor on the posterior bladder wall towards the dome.  This was completely resected.  2.  Left retrograde pyelogram without any filling defect or hydronephrosis  3.  Right retrograde pyelogram without any filling defect or hydronephrosis  Indication: 78 year old female with a bladder tumor presents for the previously mentioned operation.  Description of procedure:  The patient was identified and consent was obtained.  The patient was taken to the operating room and placed in the supine position.  The patient was placed under general anesthesia.  Perioperative antibiotics were administered.  The patient was placed in dorsal lithotomy.  Patient was prepped and draped in a standard sterile fashion and a timeout was performed.  A 26 French resectoscope with a visual obturator in place was advanced into the urethra and into the bladder.  Complete cystoscopy was performed with the findings noted above.  I exchanged for the bipolar working element and resected the tumor of interest and collected that for specimen.  I fulgurated the resection bed.  There was no evidence of any perforation.  Entire tumor was resected.  There was no active bleeding noted.  I withdrew the scope.  I advanced a 21 French rigid cystoscope into the bladder.  I intubated the left ureteral orifice with an open-ended ureteral catheter and a retrograde pyelogram  was performed with no abnormal findings.  Same was performed on the right again with no abnormal findings.  I withdrew the scope and placed a Foley catheter.  This concluded the operation.  Patient tolerated the procedure well was stable postoperatively.  In the PACU, gemcitabine was instilled into the bladder where it remained for approximately 1 hour prior to proper disposal.  Plan: Follow-up in 1 week for pathology review

## 2023-07-17 NOTE — H&P (Signed)
 CC/HPI: CC: History of bladder cancer, frequency, urgency  HPI:  06/07/2021  78 year old female who is the wife of a patient of mine. We had previously discussed her history during one of his appointments and she comes in today to establish care. She has a history of low-grade superficial bladder cancer that was resected on 01/13/2019. Her last cystoscopy was about 6 months ago. She has not had a recurrence. Her urologist retired and she wants to establish care here and is due for a cystoscopy. She also complains about daytime urinary frequency and urgency. She denies urinary incontinence. This has been occurring for a few months. She would like to try Myrbetriq.   12/03/2021  Patient presents today for surveillance cystoscopy. Myrbetriq gave her some nausea. Still has some intermittent problems with frequency/urgency. Does not want any further intervention right now.   05/28/2022  Patient presents today for surveillance cystoscopy.   06/24/2022  Patient underwent transurethral resection of bladder tumor with instillation of gemcitabine. Postoperatively had a large amount of diarrhea. Went to the emergency department and had a CT of the abdomen and pelvis. Felt to have a possible bladder perforation but subsequent cystogram showed an outpouching from the bladder consistent with her resection area that was contained without any extravasation of contrast. She also had some findings of colitis. She was ultimately admitted to the hospital. Ultimately found to have rotavirus. She has recovered for the most part but still has fatigue. 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   07/03/2023  Patient presents today for surveillance cystoscopy.     ALLERGIES: Simvastatin - ??    MEDICATIONS: Aspirin 81 mg tablet,chewable  Metoprolol Tartrate 25 mg tablet  Bentyl  Protonix 40 mg tablet, delayed release  Xanax 0.5 mg tablet     GU PSH: Bladder Instill AntiCA Agent -  06/16/2022 Cystoscopy - 03/27/2023, 12/24/2022, 09/25/2022, 05/28/2022, 12/03/2021 Cystoscopy TURBT 2-5 cm - 06/16/2022       PSH Notes: cholecystectomy with gangrene 2013, bladder surgery 2020   NON-GU PSH: Appendectomy (open) Cholecystectomy (open) Remove Tonsils     GU PMH: Bladder Cancer overlapping sites - 03/27/2023, - 12/24/2022, - 09/25/2022, - 06/24/2022, - 05/28/2022, - 12/03/2021, - 2023 Urinary Frequency - 2023 Urinary Urgency - 2023 History of bladder cancer    NON-GU PMH: Anxiety GERD    FAMILY HISTORY: 2 daughters - Other Heart Disease - Mother prostate cancer in father - Father    Notes: 2 daughters   SOCIAL HISTORY: Marital Status: Married Preferred Language: English Current Smoking Status: Patient does not smoke anymore.   Tobacco Use Assessment Completed: Used Tobacco in last 30 days? Has never drank.  Does not drink caffeine. Patient's occupation is/was retired.    REVIEW OF SYSTEMS:    GU Review Female:   Patient denies frequent urination, hard to postpone urination, burning /pain with urination, get up at night to urinate, leakage of urine, stream starts and stops, trouble starting your stream, have to strain to urinate, and being pregnant.  Gastrointestinal (Upper):   Patient denies nausea, vomiting, and indigestion/ heartburn.  Gastrointestinal (Lower):   Patient denies diarrhea and constipation.  Constitutional:   Patient denies fever, night sweats, weight loss, and fatigue.  Skin:   Patient denies skin rash/ lesion and itching.  Eyes:   Patient denies blurred vision and double vision.  Ears/ Nose/ Throat:   Patient denies sinus problems and sore throat.  Hematologic/Lymphatic:   Patient denies swollen glands and easy bruising.  Cardiovascular:  Patient denies leg swelling and chest pains.  Respiratory:   Patient denies cough and shortness of breath.  Endocrine:   Patient denies excessive thirst.  Musculoskeletal:   Patient denies back pain and joint  pain.  Neurological:   Patient denies headaches and dizziness.  Psychologic:   Patient denies depression and anxiety.   VITAL SIGNS: None   MULTI-SYSTEM PHYSICAL EXAMINATION:    Constitutional: Well-nourished. No physical deformities. Normally developed. Good grooming.  Gastrointestinal: No mass, no tenderness, no rigidity, non obese abdomen.  Eyes: Normal conjunctivae. Normal eyelids.  Musculoskeletal: Normal gait and station of head and neck.     Complexity of Data:  Source Of History:  Patient  Records Review:   Previous Doctor Records, Previous Patient Records  Urine Test Review:   Urinalysis   PROCEDURES:         Flexible Cystoscopy - 52000  Risks, benefits, and the potential complications of the procedure were discussed with the patient including infection, bleeding, voiding discomfort, urinary retention, etc. All questions were answered. Consent was obtained. Sterile technique and intraurethral analgesia were used.  Meatus:  Normal size. Normal location. Normal condition.  Urethra:  Normal urethra  Ureteral Orifices:  Normal location. Normal size. Normal shape. Effluxed clear urine.  Bladder:  No trabeculation. approximately 1 to 2 cm superficial appearing low-grade appearing bladder tumor on the posterior bladder wall towards the dome.      The procedure was well-tolerated and without complications. Instructions were given to call the office if she developed any problems. The patient stated that she understood these instructions.         Urinalysis Dipstick Dipstick Cont'd  Color: Yellow Bilirubin: Neg mg/dL  Appearance: Clear Ketones: Neg mg/dL  Specific Gravity: <=4.098 Blood: Neg ery/uL  pH: 6.5 Protein: Neg mg/dL  Glucose: Neg mg/dL Urobilinogen: 0.2 mg/dL    Nitrites: Neg    Leukocyte Esterase: Neg leu/uL    ASSESSMENT:      ICD-10 Details  1 GU:   Bladder Cancer overlapping sites - C67.8 Chronic, Stable   PLAN:           Document Letter(s):  Created for  Patient: Clinical Summary         Notes:   Recommend transurethral resection of bladder tumor with instillation of gemcitabine. Also plan to perform bilateral retrograde pyelogram. Risk benefits discussed including but not limited to bleeding, infection, bladder perforation among other imponderables.   CC: Dr. Shary Decamp   Signed by Modena Slater, III, M.D. on 07/03/23 at 10:50 AM (EST

## 2023-07-17 NOTE — Transfer of Care (Signed)
 Immediate Anesthesia Transfer of Care Note  Patient: Kara Reynolds  Procedure(s) Performed: TURBT, WITH CHEMOTHERAPEUTIC AGENT INSTILLATION INTO BLADDER, BILATERAL RETROGRADE PYLEOGRAM (Bladder)  Patient Location: PACU  Anesthesia Type:General  Level of Consciousness: sedated  Airway & Oxygen Therapy: Patient Spontanous Breathing and Patient connected to face mask oxygen  Post-op Assessment: Report given to RN and Post -op Vital signs reviewed and stable  Post vital signs: Reviewed and stable  Last Vitals:  Vitals Value Taken Time  BP 129/66 07/17/23 1405  Temp    Pulse 65 07/17/23 1407  Resp 15 07/17/23 1407  SpO2 100 % 07/17/23 1407  Vitals shown include unfiled device data.  Last Pain:  Vitals:   07/17/23 1052  TempSrc:   PainSc: 0-No pain         Complications: No notable events documented.

## 2023-07-17 NOTE — Anesthesia Procedure Notes (Signed)
 Procedure Name: Intubation Date/Time: 07/17/2023 1:26 PM  Performed by: Doran Clay, CRNAPre-anesthesia Checklist: Patient identified, Emergency Drugs available, Suction available, Patient being monitored and Timeout performed Patient Re-evaluated:Patient Re-evaluated prior to induction Oxygen Delivery Method: Circle system utilized Preoxygenation: Pre-oxygenation with 100% oxygen Induction Type: IV induction Ventilation: Mask ventilation without difficulty Laryngoscope Size: Mac and 3 Grade View: Grade I Tube type: Oral Tube size: 7.0 mm Number of attempts: 1 Airway Equipment and Method: Stylet Placement Confirmation: ETT inserted through vocal cords under direct vision, positive ETCO2 and breath sounds checked- equal and bilateral Secured at: 21 cm Tube secured with: Tape Dental Injury: Teeth and Oropharynx as per pre-operative assessment

## 2023-07-17 NOTE — Discharge Instructions (Signed)

## 2023-07-17 NOTE — Anesthesia Preprocedure Evaluation (Addendum)
 Anesthesia Evaluation  Patient identified by MRN, date of birth, ID band Patient awake    Reviewed: Allergy & Precautions, NPO status , Patient's Chart, lab work & pertinent test results, reviewed documented beta blocker date and time   Airway Mallampati: I  TM Distance: >3 FB Neck ROM: Full    Dental  (+) Missing, Dental Advisory Given,    Pulmonary COPD   Pulmonary exam normal breath sounds clear to auscultation       Cardiovascular hypertension (150/82 preop, per pt no BP issues in past), Normal cardiovascular exam+ dysrhythmias (atrial tachycardia, takes lopressor for palpitations as needed) + Valvular Problems/Murmurs (mild MR, mild TR) MR  Rhythm:Regular Rate:Normal  Echo 2023 No significant change when compared to previous study 10-24-2020.  The left ventricular size is normal.  There is normal left ventricular wall thickness.  LV ejection fraction = 60-65%.  Left ventricular systolic function is normal.  There is mild mitral regurgitation.  There is mild tricuspid regurgitation.  IVC size was mildly dilated.     Neuro/Psych  PSYCHIATRIC DISORDERS Anxiety     negative neurological ROS     GI/Hepatic Neg liver ROS, hiatal hernia,GERD  Medicated and Controlled,,  Endo/Other  negative endocrine ROS    Renal/GU negative Renal ROS Bladder dysfunction (bladder tumor)      Musculoskeletal negative musculoskeletal ROS (+)    Abdominal   Peds  Hematology negative hematology ROS (+)   Anesthesia Other Findings   Reproductive/Obstetrics negative OB ROS                             Anesthesia Physical Anesthesia Plan  ASA: 3  Anesthesia Plan: General   Post-op Pain Management: Tylenol PO (pre-op)*   Induction: Intravenous  PONV Risk Score and Plan: 3 and Ondansetron, Dexamethasone and Treatment may vary due to age or medical condition  Airway Management Planned: Oral  ETT  Additional Equipment: None  Intra-op Plan:   Post-operative Plan: Extubation in OR  Informed Consent: I have reviewed the patients History and Physical, chart, labs and discussed the procedure including the risks, benefits and alternatives for the proposed anesthesia with the patient or authorized representative who has indicated his/her understanding and acceptance.     Dental advisory given  Plan Discussed with: CRNA  Anesthesia Plan Comments:        Anesthesia Quick Evaluation

## 2023-07-17 NOTE — Anesthesia Postprocedure Evaluation (Signed)
 Anesthesia Post Note  Patient: Kara Reynolds  Procedure(s) Performed: TURBT, WITH CHEMOTHERAPEUTIC AGENT INSTILLATION INTO BLADDER, BILATERAL RETROGRADE PYLEOGRAM (Bladder)     Patient location during evaluation: PACU Anesthesia Type: General Level of consciousness: awake and alert, oriented and patient cooperative Pain management: pain level controlled Vital Signs Assessment: post-procedure vital signs reviewed and stable Respiratory status: spontaneous breathing, nonlabored ventilation and respiratory function stable Cardiovascular status: blood pressure returned to baseline and stable Postop Assessment: no apparent nausea or vomiting Anesthetic complications: no   No notable events documented.  Last Vitals:  Vitals:   07/17/23 1430 07/17/23 1445  BP: 134/67 135/70  Pulse: (!) 57 62  Resp: 13 12  Temp:    SpO2: 96% 97%    Last Pain:  Vitals:   07/17/23 1445  TempSrc:   PainSc: 0-No pain                 Lannie Fields

## 2023-07-20 LAB — SURGICAL PATHOLOGY

## 2023-09-04 ENCOUNTER — Encounter (HOSPITAL_BASED_OUTPATIENT_CLINIC_OR_DEPARTMENT_OTHER): Payer: Self-pay

## 2023-09-04 ENCOUNTER — Ambulatory Visit (HOSPITAL_BASED_OUTPATIENT_CLINIC_OR_DEPARTMENT_OTHER): Admission: EM | Admit: 2023-09-04 | Discharge: 2023-09-04 | Disposition: A | Discharge status: Home / Self Care

## 2023-09-04 DIAGNOSIS — H938X3 Other specified disorders of ear, bilateral: Secondary | ICD-10-CM | POA: Diagnosis not present

## 2023-09-04 NOTE — ED Triage Notes (Signed)
 Bilateral ear pressure since yesterday. States having difficulty hearing people. + headache. States head feels like she is spinning.

## 2023-09-04 NOTE — ED Provider Notes (Signed)
 [facility]    CSN: [id number] Arrival date & time: 09/04/23  1313      History   Chief Complaint No chief complaint on file.   HPI Kara Reynolds is a 78 y.o. female.   Patient is a 78 year old female that presents today with difficulty hearing.  Reports bilateral pressure in both ears since yesterday.  She has had some nasal congestion and mild headache.  Reporting some bouts of vertigo.  Symptoms started after mowing the yard yesterday.  She has had some congestion for the past few weeks.  She has not taken any medication for her symptoms.  She denies any fevers, chills, body aches, night sweats, cough or chest congestion     Past Medical History:  Diagnosis Date   Acute cystitis without hematuria 07/03/2016   Anxiety 06/29/2015   Last Assessment & Plan:  Needs refill on this does not use it daily at all , but it does help her and she would like to continue with this and never fills this consistently    Bladder cancer [facility])    Bladder cancer   Chest discomfort 10/29/2016   Colon polyp    Diverticulitis    Encounter for screening colonoscopy 01/03/2016   GERD (gastroesophageal reflux disease) 06/29/2015   Last Assessment & Plan:  We discussed the side effects of the PPIs and use of zantac alternating if she would like, and she feels the PPI is needed for her GERD and does helps her, discussed bone loss as well as memory issues with the class as well   History of hiatal hernia    Hypercholesteremia 06/29/2015   Last Assessment & Plan:  Update her fasting labs for her today and discussed diet which she and her husband are working on more with her weight as well   IBS (irritable bowel syndrome)    Malaise and fatigue 07/02/2015   Last Assessment & Plan:  Off and on for her, she tries to remain active is retired now worked at [employer] and has had blood exposure and she is going to have the hepatitis C panel drawn for this purpose   Mild mitral regurgitation     Mitral 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 She isnt willing.  Ordering a new dexa.  Last Assessment & Plan:  Update her vitamin d level, she is not on meds for this and had her last exam this year in februray   Other emphysema (HCC) 07/03/2016   Palpitations 10/29/2016   Takes metoprolol   PRN   Paroxysmal atrial tachycardia (HCC) 10/29/2016   Postmenopausal 06/29/2015   Last Assessment & Plan:  Not using any hormones, she feels she is doing well and has her mammograms done at [place]   Vitamin D deficiency 06/29/2015   Last Assessment & Plan:  Update her level for her today and see where she is at    Patient Active Problem List   Diagnosis Date Noted   Colitis 06/20/2022   Chest pain 03/16/2017   Chest discomfort 10/29/2016   Palpitations 10/29/2016   Paroxysmal atrial tachycardia (HCC) 10/29/2016   Acute cystitis without hematuria 07/03/2016   Other emphysema (HCC) 07/03/2016   Encounter for screening colonoscopy 01/03/2016   Malaise and fatigue 07/02/2015   Anxiety 06/29/2015   GERD (gastroesophageal reflux disease) 06/29/2015   Hypercholesteremia 06/29/2015   Mitral valve  disorder 06/29/2015   Osteoporosis 06/29/2015   Postmenopausal 06/29/2015   Vitamin D deficiency 06/29/2015    Past Surgical History:  Procedure Laterality Date   APPENDECTOMY     teenager   CHOLECYSTECTOMY     CYSTOSCOPY W/ URETERAL STENT PLACEMENT Bilateral 06/16/2022   Procedure: BILATERAL RETROGRADE PYELOGRAM;  Surgeon: Samson Croak, MD;  Location: [facility];  Service: Urology;  Laterality: Bilateral;   left thumb surgery      TONSILLECTOMY     early 20's   TRANSURETHRAL RESECTION OF BLADDER TUMOR  01/13/2019    OB History   No obstetric history on file.      Home Medications    Prior to Admission medications   Medication Sig Start Date End Date Taking? Authorizing  Provider  acetaminophen  (TYLENOL ) 500 MG tablet Take 1,000 mg by mouth as needed (pain.).    [provider]  ALPRAZolam  (XANAX ) 0.5 MG tablet Take 0.5 mg by mouth 3 (three) times daily as needed for sleep or anxiety.    [provider]  dicyclomine  (BENTYL ) 10 MG capsule Take 1 capsule (10 mg total) by mouth 2 (two) times daily. Patient taking differently: Take 10 mg by mouth 4 (four) times daily as needed for spasms. 02/23/18   Lajuan Pila, MD  ibuprofen (ADVIL) 200 MG tablet Take 200-400 mg by mouth as needed for mild pain (pain score 1-3).    [provider]  metoprolol  tartrate (LOPRESSOR ) 25 MG tablet Take 2 tablets (50 mg total) daily as needed by mouth. Patient taking differently: Take 25 mg by mouth as needed (palpitations). 03/18/17   Hassan Links, MD  pantoprazole  (PROTONIX ) 40 MG tablet Take 1 tablet (40 mg total) by mouth daily. 07/09/23   Lajuan Pila, MD    Family History Family History  Problem Relation Age of Onset   Prostate cancer Father        died about 15 years ago   Stroke Maternal Grandfather    Diabetes Child    Colon cancer Neg Hx    Esophageal cancer Neg Hx    Liver disease Neg Hx     Social History Social History   Tobacco Use   Smoking status: Never   Smokeless tobacco: Never  Vaping Use   Vaping status: Never Used  Substance Use Topics   Alcohol use: No   Drug use: Never     Allergies   Pseudoephedrine   Review of Systems Review of Systems See HPI  Physical Exam Triage Vital Signs ED Triage Vitals  Encounter Vitals Group     BP 09/04/23 1340 (!) 160/78     Systolic BP Percentile --      Diastolic BP Percentile --      Pulse Rate 09/04/23 1340 65     Resp 09/04/23 1340 20     Temp 09/04/23 1340 97.7 F (36.5 C)     Temp Source 09/04/23 1340 Oral     SpO2 09/04/23 1340 96 %     Weight --      Height --      Head Circumference --      Peak Flow --      Pain Score 09/04/23 1343 5     Pain Loc --       Pain Education --      Exclude from Growth Chart --    No data found.  Updated Vital Signs BP (!) 160/78 (BP Location: Right Arm)   Pulse 65  Temp 97.7 F (36.5 C) (Oral)   Resp 20   SpO2 96%   Visual Acuity Right Eye Distance:   Left Eye Distance:   Bilateral Distance:    Right Eye Near:   Left Eye Near:    Bilateral Near:     Physical Exam Vitals and nursing note reviewed.  Constitutional:      General: She is not in acute distress.    Appearance: Normal appearance. She is not ill-appearing, toxic-appearing or diaphoretic.  HENT:     Head: Normocephalic and atraumatic.     Right Ear: Tympanic membrane, ear canal and external ear normal.     Left Ear: Tympanic membrane, ear canal and external ear normal.     Nose: Congestion and rhinorrhea present.     Mouth/Throat:     Pharynx: Oropharynx is clear.  Eyes:     Conjunctiva/sclera: Conjunctivae normal.  Cardiovascular:     Rate and Rhythm: Normal rate and regular rhythm.     Pulses: Normal pulses.     Heart sounds: Normal heart sounds.  Pulmonary:     Effort: Pulmonary effort is normal.     Breath sounds: Normal breath sounds.  Skin:    General: Skin is warm and dry.  Neurological:     Mental Status: She is alert.  Psychiatric:        Mood and Affect: Mood normal.      UC Treatments / Results  Labs (all labs ordered are listed, but only abnormal results are displayed) Labs Reviewed - No data to display  EKG   Radiology No results found.  Procedures Procedures (including critical care time)  Medications Ordered in UC Medications - No data to display  Initial Impression / Assessment and Plan / UC Course  I have reviewed the triage vital signs and the nursing notes.  Pertinent labs & imaging results that were available during my care of the patient were reviewed by me and considered in my medical decision making (see chart for details).     Bilateral ear congestion-no concerns on exam.   This is most likely due to allergies and eustachian tube dysfunction.  Recommend start taking Zyrtec and Flonase daily. Follow-up for any continued or worsening issues Final Clinical Impressions(s) / UC Diagnoses   Final diagnoses:  Ear congestion, bilateral     Discharge Instructions      I believe that your ears are congested due to eustachian tube dysfunction Recommend daily Zyrtec and Flonase Follow-up for any continued issues    ED Prescriptions   None    PDMP not reviewed this encounter.   Landa Pine, FNP 09/04/23 [phone]

## 2023-09-04 NOTE — Discharge Instructions (Signed)
 I believe that your ears are congested due to eustachian tube dysfunction Recommend daily Zyrtec and Flonase Follow-up for any continued issues

## 2023-10-01 ENCOUNTER — Ambulatory Visit: Payer: Medicare HMO | Admitting: Gastroenterology

## 2023-10-01 ENCOUNTER — Encounter: Payer: Self-pay | Admitting: Gastroenterology

## 2023-10-01 VITALS — BP 122/68 | HR 64 | Ht 64.5 in | Wt 161.0 lb

## 2023-10-01 DIAGNOSIS — K449 Diaphragmatic hernia without obstruction or gangrene: Secondary | ICD-10-CM | POA: Diagnosis not present

## 2023-10-01 DIAGNOSIS — K219 Gastro-esophageal reflux disease without esophagitis: Secondary | ICD-10-CM | POA: Diagnosis not present

## 2023-10-01 DIAGNOSIS — Z8719 Personal history of other diseases of the digestive system: Secondary | ICD-10-CM | POA: Diagnosis not present

## 2023-10-01 DIAGNOSIS — K581 Irritable bowel syndrome with constipation: Secondary | ICD-10-CM

## 2023-10-01 DIAGNOSIS — C679 Malignant neoplasm of bladder, unspecified: Secondary | ICD-10-CM

## 2023-10-01 MED ORDER — PANTOPRAZOLE SODIUM 40 MG PO TBEC: 40.0000 mg | DELAYED_RELEASE_TABLET | Freq: Every day | ORAL | 3 refills | Status: AC

## 2023-10-01 MED ORDER — DICYCLOMINE HCL 10 MG PO CAPS
10.0000 mg | ORAL_CAPSULE | Freq: Two times a day (BID) | ORAL | 0 refills | Status: AC | PRN
Start: 2023-10-01 — End: ?

## 2023-10-01 NOTE — Progress Notes (Signed)
 Chief Complaint: FU  Referring Provider:  Edra Govern*      ASSESSMENT AND PLAN;   #1. IBS-C. H/O sigmoid diverticulitis 04/13/2023 and assoc constipation.  No abscesses.  Prev colon 2018 with pancolonic div as below. Pt with chronic pelvic pain.  #2. Bladder Ca s/p TURBT/intravesicular gemcitibine 06/16/2022 (Dr Parke Boll). Awaiting cystoscopy June 27th 2025  #3. Complicated biliary history.  S/P lap chole 11/2011 with cystic stump leak s/p ERCP with biliary sphincterotomy/stent insertion 12/06/2011, s/p removal therafter. US  01/2018 - showing very early cirrhosis per report (doubt significance), Nl LFTs with alb 4.6 01/2018   #4. GERD with small HH. UGI with SB series 01/2018- small asymptomatic Zenker's, small hiatal hernia.  Otherwise Nl. Neg EGD 02/2018 except for small HH fundic gland polyps.   Plan: - Continue protonix  40mg  po every day #90, 4RF - Bentyl  10mg  po BID prn #60 2RF - Proceed with endometrial Bx and cystoscopy as planned - Prunes 2/day + metamucil - RTC 1 yr. Earlier if any problems.   Addendum-Andrea's mom.  HPI:    Kara Reynolds is a 78 y.o. female Andrea's mom (NP with Dr Bert Britain) With atrial tachcardia, mild MR, HLD, palpitations (Dr Ransom Byers), bladder Ca s/p TURBT.  FU Doing good from GI standpoint  History of Present Illness Kara Reynolds is a 78 year old female who presents with lower abdominal pain.  She experiences lower abdominal pain, described as similar to menstrual cramps.  Pelvic US  with endometrial thickening Scheduled for endometrial biopsy by Dr. Wayna Hails  She has a history of gastrointestinal issues and is currently taking Bentyl  and Protonix , which she finds helpful. She uses Metamucil as needed, typically mixing it with fiber in eight ounces of water , and consumes two prunes daily to maintain regular bowel movements. Her constipation has improved with this regimen.    From previous notes:  Adm to [facility] 2/9- 2/11 with acute GEitis  after TURBT 2/5 and intravesicular gemcitabine .  Her white cell count and hemoglobin did drop thought to be due to bone marrow suppression. CT showed acute colitis. Stool + Rotavirus. Neg C Diff. 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  Treated initially with antibiotics then conservatively.  Past GI procedures:  CT AP with contrast 04/13/2023 1. Findings consistent with acute diverticulitis involving the distal descending and sigmoid colon. No perforation or abscess. 2. Hepatic steatosis. 3. Aortic atherosclerosis.  -EGD10/2019-small hiatal hernia, fundic gland polyps.  Negative HP.  03/15/2014: Small hiatal hernia, mild gastritis, neg SB Bx for celiac, negative biopsies for HP.  -History of IBS with alternating diarrhea and constipation, last colonoscopy 07/09/2016 showing pancolonic diverticulosis predominantly in the sigmoid colon.  CT AP with contrast 09/08/2022 No acute findings. Interval resolution of bladder wall thickening and extraluminal gas since previous exam. Colonic diverticulosis, without radiographic evidence of diverticulitis. Stable hepatic steatosis. Aortic Atherosclerosis (ICD10-I70.0).  Past Medical History:  Diagnosis Date   Acute cystitis without hematuria 07/03/2016   Anxiety 06/29/2015   Last Assessment & Plan:  Needs refill on this does not use it daily at all , but it does help her and she would like to continue with this and never fills this consistently    Bladder cancer [facility])    Bladder cancer   Chest discomfort 10/29/2016   Colon polyp    Diverticulitis    Encounter for screening colonoscopy 01/03/2016   GERD (gastroesophageal reflux disease) 06/29/2015   Last Assessment & Plan:  We discussed the side effects of the  PPIs and use of zantac alternating if she would like, and she feels the PPI is needed for her GERD and does helps her, discussed bone loss as well as memory issues with the class as well   History of hiatal hernia    Hypercholesteremia  06/29/2015   Last Assessment & Plan:  Update her fasting labs for her today and discussed diet which she and her husband are working on more with her weight as well   IBS (irritable bowel syndrome)    Malaise and fatigue 07/02/2015   Last Assessment & Plan:  Off and on for her, she tries to remain active is retired now worked at [employer] and has had blood exposure and she is going to have the hepatitis C panel drawn for this purpose   Mild mitral regurgitation    Mitral 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 She isnt willing.  Ordering a new dexa.  Last Assessment & Plan:  Update her vitamin d level, she is not on meds for this and had her last exam this year in februray   Other emphysema (HCC) 07/03/2016   Palpitations 10/29/2016   Takes metoprolol   PRN   Paroxysmal atrial tachycardia (HCC) 10/29/2016   Postmenopausal 06/29/2015   Last Assessment & Plan:  Not using any hormones, she feels she is doing well and has her mammograms done at [place]   Vitamin D deficiency 06/29/2015   Last Assessment & Plan:  Update her level for her today and see where she is at    Past Surgical History:  Procedure Laterality Date   APPENDECTOMY     teenager   CHOLECYSTECTOMY     CYSTOSCOPY W/ URETERAL STENT PLACEMENT Bilateral 06/16/2022   Procedure: BILATERAL RETROGRADE PYELOGRAM;  Surgeon: Samson Croak, MD;  Location: [facility];  Service: Urology;  Laterality: Bilateral;   left thumb surgery      TONSILLECTOMY     early 20's   TRANSURETHRAL RESECTION OF BLADDER TUMOR  01/13/2019    Family History  Problem Relation Age of Onset   Prostate cancer Father        died about 15 years ago   Stroke Maternal Grandfather    Diabetes Child    Colon cancer Neg Hx    Esophageal cancer Neg Hx    Liver disease Neg Hx     Social History   Tobacco Use   Smoking status: Never    Smokeless tobacco: Never  Vaping Use   Vaping status: Never Used  Substance Use Topics   Alcohol use: No   Drug use: Never    Current Outpatient Medications  Medication Sig Dispense Refill   acetaminophen  (TYLENOL ) 500 MG tablet Take 1,000 mg by mouth as needed (pain.).     ALPRAZolam  (XANAX ) 0.5 MG tablet Take 0.5 mg by mouth 3 (three) times daily as needed for sleep or anxiety.     dicyclomine  (BENTYL ) 10 MG capsule Take 1 capsule (10 mg total) by mouth 2 (two) times daily. (Patient taking differently: Take 10 mg by mouth 4 (four) times daily as needed for spasms.) 60 capsule 6   FIBER PO Take by mouth daily.     ibuprofen (ADVIL) 200 MG tablet Take 200-400 mg by mouth as needed for mild pain (pain score 1-3).     metoprolol  tartrate (LOPRESSOR ) 25 MG tablet Take  2 tablets (50 mg total) daily as needed by mouth. (Patient taking differently: Take 25 mg by mouth as needed (palpitations).) 30 tablet 1   pantoprazole  (PROTONIX ) 40 MG tablet Take 1 tablet (40 mg total) by mouth daily. 90 tablet 4   No current facility-administered medications for this visit.    Allergies  Allergen Reactions   Pseudoephedrine Palpitations    Review of Systems:  Has situational anxiety      Physical Exam:    BP 122/68   Pulse 64   Wt 161 lb (73 kg)   BMI 27.21 kg/m  Filed Weights   10/01/23 1126  Weight: 161 lb (73 kg)   Constitutional:  Well-developed, in no acute distress. Psychiatric: Normal mood and affect. Behavior is normal. HEENT: Conjunctivae are normal. No scleral icterus. Cardiovascular: Normal rate, regular rhythm. No edema Pulmonary/chest: Effort normal and breath sounds normal. No wheezing, rales or rhonchi. Abdominal: Soft, nondistended.  Minimal left lower quadrant abdominal tenderness without rebound.  Bowel sounds active throughout. There are no masses palpable. No hepatomegaly.  CT reviewed with the patient.   Magnus Schuller, MD 10/01/2023, 11:44 AM  Cc: Brown-Patram,  Melissa J*

## 2023-10-01 NOTE — Patient Instructions (Signed)
 _______________________________________________________  If your blood pressure at your visit was 140/90 or greater, please contact your primary care physician to follow up on this.  _______________________________________________________  If you are age 77 or older, your body mass index should be between 23-30. Your Body mass index is 27.21 kg/m. If this is out of the aforementioned range listed, please consider follow up with your Primary Care Provider.  If you are age 33 or younger, your body mass index should be between 19-25. Your Body mass index is 27.21 kg/m. If this is out of the aformentioned range listed, please consider follow up with your Primary Care Provider.   ________________________________________________________  The [facility] providers would like to encourage you to use MYCHART to communicate with providers for non-urgent requests or questions.  Due to long hold times on the telephone, sending your provider a message by [facility] may be a faster and more efficient way to get a response.  Please allow 48 business hours for a response.  Please remember that this is for non-urgent requests.  _______________________________________________________  We have sent the following medications to your pharmacy for you to pick up at your convenience: Protonix  Bentyl   Please follow up in 12 months. Give us  a call at [phone] to schedule an appointment.  Thank you,  Dr. Lajuan Pila

## 2023-11-09 ENCOUNTER — Other Ambulatory Visit: Payer: Self-pay | Admitting: Urology

## 2023-11-09 MED ORDER — GEMCITABINE CHEMO FOR BLADDER INSTILLATION 2000 MG: 2000.0000 mg | Freq: Once | INTRAVENOUS | Status: AC

## 2023-11-17 NOTE — Patient Instructions (Signed)
 SURGICAL WAITING ROOM VISITATION Patients having surgery or a procedure may have no more than 2 support people in the waiting area - these visitors may rotate in the visitor waiting room.   Due to an increase in RSV and influenza rates and associated hospitalizations, children ages 85 and under may not visit patients in [facility] hospitals. If the patient needs to stay at the hospital during part of their recovery, the visitor guidelines for inpatient rooms apply.  PRE-OP VISITATION  Pre-op nurse will coordinate an appropriate time for 1 support person to accompany the patient in pre-op.  This support person may not rotate.  This visitor will be contacted when the time is appropriate for the visitor to come back in the pre-op area.  Please refer to the [facility] website for the visitor guidelines for Inpatients (after your surgery is over and you are in a regular room).  You are not required to quarantine at this time prior to your surgery. However, you must do this: Hand Hygiene often Do NOT share personal items Notify your provider if you are in close contact with someone who has COVID or you develop fever 100.4 or greater, new onset of sneezing, cough, sore throat, shortness of breath or body aches.  If you test positive for Covid or have been in contact with anyone that has tested positive in the last 10 days please notify you surgeon.    Your procedure is scheduled on:  11/20/23  Report to [facility] Main Entrance: [place] entrance where the [employer] is available.   Report to admitting at: 10:00 AM  Call this number if you have any questions or problems the morning of surgery [phone]  FOLLOW ANY ADDITIONAL PRE OP INSTRUCTIONS YOU RECEIVED FROM YOUR SURGEON'S OFFICE!!!  Do not eat food after Midnight the night prior to your surgery/procedure.  After Midnight you may have the following liquids until: 9:00 AM  DAY OF SURGERY  Clear Liquid Diet Water  Black  Coffee (sugar ok, NO MILK/CREAM OR CREAMERS)  Tea (sugar ok, NO MILK/CREAM OR CREAMERS) regular and decaf                             Plain Jell-O  with no fruit (NO RED)                                           Fruit ices (not with fruit pulp, NO RED)                                     Popsicles (NO RED)                                                                  Juice: NO CITRUS JUICES: only apple, WHITE grape, WHITE cranberry Sports drinks like Gatorade or Powerade (NO RED)   Oral Hygiene is also important to reduce your risk of infection.        Remember - BRUSH YOUR TEETH THE MORNING OF SURGERY WITH YOUR REGULAR  TOOTHPASTE  Do NOT smoke after Midnight the night before surgery.  STOP TAKING all Vitamins, Herbs and supplements 1 week before your surgery.   Take ONLY these medicines the morning of surgery with A SIP OF WATER : escitalopram,pantoprazole .Tylenol ,metoprolol  as needed.                   You may not have any metal on your body including hair pins, jewelry, and body piercing  Do not wear make-up, lotions, powders, perfumes / cologne, or deodorant  Do not wear nail polish including gel and S&S, artificial / acrylic nails, or any other type of covering on natural nails including finger and toenails. If you have artificial nails, gel coating, etc., that needs to be removed by a nail salon, Please have this removed prior to surgery. Not doing so may mean that your surgery could be cancelled or delayed if the Surgeon or anesthesia staff feels like they are unable to monitor you safely.   Do not shave 48 hours prior to surgery to avoid nicks in your skin which may contribute to postoperative infections.   Contacts, Hearing Aids, dentures or bridgework may not be worn into surgery. DENTURES WILL BE REMOVED PRIOR TO SURGERY PLEASE DO NOT APPLY Poly grip OR ADHESIVES!!!  You may bring a small overnight bag with you on the day of surgery, only pack items that are not valuable.  [facility] IS NOT RESPONSIBLE   FOR VALUABLES THAT ARE LOST OR STOLEN.   Patients discharged on the day of surgery will not be allowed to drive home.  Someone NEEDS to stay with you for the first 24 hours after anesthesia.  Do not bring your home medications to the hospital. The Pharmacy will dispense medications listed on your medication list to you during your admission in the Hospital.  Special Instructions: Bring a copy of your healthcare power of attorney and living will documents the day of surgery, if you wish to have them scanned into your [place] [place] [facility] Records- EPIC  Please read over the following fact sheets you were given: IF YOU HAVE QUESTIONS ABOUT YOUR PRE-OP INSTRUCTIONS, PLEASE CALL [phone]   [facility] - Preparing for Surgery Before surgery, you can play an important role.  Because skin is not sterile, your skin needs to be as free of germs as possible.  You can reduce the number of germs on your skin by washing with CHG (chlorahexidine gluconate) soap before surgery.  CHG is an antiseptic cleaner which kills germs and bonds with the skin to continue killing germs even after washing. Please DO NOT use if you have an allergy to CHG or antibacterial soaps.  If your skin becomes reddened/irritated stop using the CHG and inform your nurse when you arrive at Short Stay. Do not shave (including legs and underarms) for at least 48 hours prior to the first CHG shower.  You may shave your face/neck.  Please follow these instructions carefully:  1.  Shower with CHG Soap the night before surgery and the  morning of surgery.  2.  If you choose to wash your hair, wash your hair first as usual with your normal  shampoo.  3.  After you shampoo, rinse your hair and body thoroughly to remove the shampoo.                             4.  Use CHG as you would any other liquid soap.  You can apply chg directly to the skin and wash.  Gently with a scrungie or clean washcloth.  5.  Apply  the CHG Soap to your body ONLY FROM THE NECK DOWN.   Do not use on face/ open                           Wound or open sores. Avoid contact with eyes, ears mouth and genitals (private parts).                       Wash face,  Genitals (private parts) with your normal soap.             6.  Wash thoroughly, paying special attention to the area where your  surgery  will be performed.  7.  Thoroughly rinse your body with warm water  from the neck down.  8.  DO NOT shower/wash with your normal soap after using and rinsing off the CHG Soap.            9.  Pat yourself dry with a clean towel.            10.  Wear clean pajamas.            11.  Place clean sheets on your bed the night of your first shower and do not  sleep with pets.  ON THE DAY OF SURGERY : Do not apply any lotions/deodorants the morning of surgery.  Please wear clean clothes to the hospital/surgery center.     FAILURE TO FOLLOW THESE INSTRUCTIONS MAY RESULT IN THE CANCELLATION OF YOUR SURGERY  PATIENT SIGNATURE_________________________________  NURSE SIGNATURE__________________________________  ________________________________________________________________________

## 2023-11-18 ENCOUNTER — Encounter (HOSPITAL_COMMUNITY): Payer: Self-pay

## 2023-11-18 ENCOUNTER — Other Ambulatory Visit: Payer: Self-pay

## 2023-11-18 ENCOUNTER — Encounter (HOSPITAL_COMMUNITY)
Admission: RE | Admit: 2023-11-18 | Discharge: 2023-11-18 | Disposition: A | Discharge status: Home / Self Care | Source: Ambulatory Visit | Attending: Urology | Admitting: Urology

## 2023-11-18 VITALS — BP 120/66 | HR 63 | Temp 98.3°F | Ht 65.0 in | Wt 156.0 lb

## 2023-11-18 DIAGNOSIS — Z01812 Encounter for preprocedural laboratory examination: Secondary | ICD-10-CM | POA: Diagnosis present

## 2023-11-18 DIAGNOSIS — I4719 Other supraventricular tachycardia: Secondary | ICD-10-CM | POA: Diagnosis not present

## 2023-11-18 HISTORY — DX: Cardiac arrhythmia, unspecified: I49.9

## 2023-11-18 LAB — CBC
HCT: 41.7 % (ref 36.0–46.0)
Hemoglobin: 13.8 g/dL (ref 12.0–15.0)
MCH: 31.8 pg (ref 26.0–34.0)
MCHC: 33.1 g/dL (ref 30.0–36.0)
MCV: 96.1 fL (ref 80.0–100.0)
Platelets: 212 K/uL (ref 150–400)
RBC: 4.34 MIL/uL (ref 3.87–5.11)
RDW: 12.2 % (ref 11.5–15.5)
WBC: 6.1 K/uL (ref 4.0–10.5)
nRBC: 0 % (ref 0.0–0.2)

## 2023-11-18 LAB — BASIC METABOLIC PANEL WITH GFR
Anion gap: 6 (ref 5–15)
BUN: 12 mg/dL (ref 8–23)
CO2: 28 mmol/L (ref 22–32)
Calcium: 9.2 mg/dL (ref 8.9–10.3)
Chloride: 103 mmol/L (ref 98–111)
Creatinine, Ser: 0.7 mg/dL (ref 0.44–1.00)
GFR, Estimated: >60 mL/min (ref >60)
Glucose, Bld: 88 mg/dL (ref 70–99)
Potassium: 4.1 mmol/L (ref 3.5–5.1)
Sodium: 137 mmol/L (ref 135–145)

## 2023-11-18 NOTE — Progress Notes (Signed)
 For Anesthesia: PCP - Benson Eleanor Rung, NP LOV: 10/16/23 Cardiologist - Debby Isla Manner, MD LOV: 09/21/23  Bowel Prep reminder:  Chest x-ray -  EKG - 07/15/23 Stress Test -  ECHO -  Cardiac Cath -  Pacemaker/ICD device last checked: Pacemaker orders received: Device Rep notified:  Spinal Cord Stimulator:N/A  Sleep Study - N/A CPAP -   Fasting Blood Sugar - N/A Checks Blood Sugar _____ times a day Date and result of last Hgb A1c-  Last dose of GLP1 agonist- N/A GLP1 instructions:   Last dose of SGLT-2 inhibitors- N/A SGLT-2 instructions:   Blood Thinner Instructions:N/A Aspirin Instructions: Last Dose:  Activity level: Can go up a flight of stairs and activities of daily living without stopping and without chest pain and/or shortness of breath   Able to exercise without chest pain and/or shortness of breath  Anesthesia review: Mild mitral regurgitation, Paroxysmal atrial tachycardia,palpitations   Patient denies shortness of breath, fever, cough and chest pain at PAT appointment   Patient verbalized understanding of instructions that were reviewed over the telephone.

## 2023-11-20 ENCOUNTER — Encounter (HOSPITAL_COMMUNITY): Payer: Self-pay | Admitting: Urology

## 2023-11-20 ENCOUNTER — Ambulatory Visit (HOSPITAL_COMMUNITY): Payer: Self-pay | Admitting: Anesthesiology

## 2023-11-20 ENCOUNTER — Ambulatory Visit (HOSPITAL_COMMUNITY)
Admission: RE | Admit: 2023-11-20 | Discharge: 2023-11-20 | Disposition: A | Discharge status: Home / Self Care | Attending: Urology | Admitting: Urology

## 2023-11-20 ENCOUNTER — Ambulatory Visit (HOSPITAL_COMMUNITY)

## 2023-11-20 ENCOUNTER — Other Ambulatory Visit: Payer: Self-pay

## 2023-11-20 ENCOUNTER — Encounter (HOSPITAL_COMMUNITY)
Admission: RE | Disposition: A | Discharge status: Home / Self Care | Payer: Self-pay | Source: Home / Self Care | Attending: Urology

## 2023-11-20 ENCOUNTER — Ambulatory Visit (HOSPITAL_BASED_OUTPATIENT_CLINIC_OR_DEPARTMENT_OTHER): Payer: Self-pay | Admitting: Anesthesiology

## 2023-11-20 DIAGNOSIS — J438 Other emphysema: Secondary | ICD-10-CM

## 2023-11-20 DIAGNOSIS — R011 Cardiac murmur, unspecified: Secondary | ICD-10-CM | POA: Insufficient documentation

## 2023-11-20 DIAGNOSIS — F419 Anxiety disorder, unspecified: Secondary | ICD-10-CM | POA: Diagnosis not present

## 2023-11-20 DIAGNOSIS — E7800 Pure hypercholesterolemia, unspecified: Secondary | ICD-10-CM | POA: Diagnosis not present

## 2023-11-20 DIAGNOSIS — I349 Nonrheumatic mitral valve disorder, unspecified: Secondary | ICD-10-CM

## 2023-11-20 DIAGNOSIS — Z8551 Personal history of malignant neoplasm of bladder: Secondary | ICD-10-CM | POA: Insufficient documentation

## 2023-11-20 DIAGNOSIS — I081 Rheumatic disorders of both mitral and tricuspid valves: Secondary | ICD-10-CM | POA: Insufficient documentation

## 2023-11-20 DIAGNOSIS — K589 Irritable bowel syndrome without diarrhea: Secondary | ICD-10-CM | POA: Insufficient documentation

## 2023-11-20 DIAGNOSIS — D303 Benign neoplasm of bladder: Secondary | ICD-10-CM | POA: Insufficient documentation

## 2023-11-20 DIAGNOSIS — C678 Malignant neoplasm of overlapping sites of bladder: Secondary | ICD-10-CM | POA: Diagnosis present

## 2023-11-20 DIAGNOSIS — M810 Age-related osteoporosis without current pathological fracture: Secondary | ICD-10-CM | POA: Insufficient documentation

## 2023-11-20 DIAGNOSIS — D494 Neoplasm of unspecified behavior of bladder: Secondary | ICD-10-CM | POA: Diagnosis not present

## 2023-11-20 DIAGNOSIS — K449 Diaphragmatic hernia without obstruction or gangrene: Secondary | ICD-10-CM | POA: Insufficient documentation

## 2023-11-20 DIAGNOSIS — E785 Hyperlipidemia, unspecified: Secondary | ICD-10-CM | POA: Insufficient documentation

## 2023-11-20 DIAGNOSIS — K219 Gastro-esophageal reflux disease without esophagitis: Secondary | ICD-10-CM | POA: Insufficient documentation

## 2023-11-20 HISTORY — PX: CYSTOSCOPY W/ RETROGRADES: SHX1426

## 2023-11-20 HISTORY — PX: BLADDER INSTILLATION: SHX6893

## 2023-11-20 HISTORY — PX: CYSTOSCOPY WITH FULGERATION: SHX6638

## 2023-11-20 SURGERY — CYSTOSCOPY, WITH BLADDER FULGURATION
Anesthesia: General

## 2023-11-20 MED ORDER — LIDOCAINE HCL (PF) 2 % IJ SOLN
INTRAMUSCULAR | Status: DC | PRN
  Administered 2023-11-20: 100 mg via INTRADERMAL

## 2023-11-20 MED ORDER — ACETAMINOPHEN 500 MG PO TABS
1000.0000 mg | ORAL_TABLET | Freq: Once | ORAL | Status: AC
  Administered 2023-11-20: 1000 mg via ORAL
  Filled 2023-11-20: qty 2

## 2023-11-20 MED ORDER — FENTANYL CITRATE (PF) 100 MCG/2ML IJ SOLN
INTRAMUSCULAR | Status: DC | PRN
  Administered 2023-11-20: 50 ug via INTRAVENOUS
  Administered 2023-11-20 (×2): 25 ug via INTRAVENOUS

## 2023-11-20 MED ORDER — STERILE WATER FOR IRRIGATION IR SOLN
Status: DC | PRN
Start: 2023-11-20 — End: 2023-11-20
  Administered 2023-11-20: 3000 mL

## 2023-11-20 MED ORDER — CHLORHEXIDINE GLUCONATE 0.12 % MT SOLN
15.0000 mL | Freq: Once | OROMUCOSAL | Status: AC
  Administered 2023-11-20: 15 mL via OROMUCOSAL

## 2023-11-20 MED ORDER — AMISULPRIDE (ANTIEMETIC) 5 MG/2ML IV SOLN: 10.0000 mg | Freq: Once | INTRAVENOUS | Status: DC | PRN

## 2023-11-20 MED ORDER — OXYCODONE HCL 5 MG PO TABS: 5.0000 mg | ORAL_TABLET | Freq: Once | ORAL | Status: DC | PRN

## 2023-11-20 MED ORDER — GEMCITABINE CHEMO FOR BLADDER INSTILLATION 2000 MG
2000.0000 mg | Freq: Once | INTRAVENOUS | Status: AC
  Administered 2023-11-20: 2000 mg via INTRAVESICAL
  Filled 2023-11-20: qty 2000

## 2023-11-20 MED ORDER — OXYCODONE HCL 5 MG/5ML PO SOLN: 5.0000 mg | Freq: Once | ORAL | Status: DC | PRN

## 2023-11-20 MED ORDER — EPHEDRINE 5 MG/ML INJ
INTRAVENOUS | Status: AC
  Filled 2023-11-20: qty 5

## 2023-11-20 MED ORDER — PROPOFOL 10 MG/ML IV BOLUS
INTRAVENOUS | Status: AC
  Filled 2023-11-20: qty 20

## 2023-11-20 MED ORDER — ORAL CARE MOUTH RINSE: 15.0000 mL | Freq: Once | OROMUCOSAL | Status: AC

## 2023-11-20 MED ORDER — EPHEDRINE SULFATE-NACL 50-0.9 MG/10ML-% IV SOSY
PREFILLED_SYRINGE | INTRAVENOUS | Status: DC | PRN
Start: 2023-11-20 — End: 2023-11-20
  Administered 2023-11-20 (×3): 5 mg via INTRAVENOUS

## 2023-11-20 MED ORDER — FENTANYL CITRATE (PF) 100 MCG/2ML IJ SOLN
INTRAMUSCULAR | Status: AC
  Filled 2023-11-20: qty 2

## 2023-11-20 MED ORDER — LACTATED RINGERS IV SOLN: INTRAVENOUS | Status: DC

## 2023-11-20 MED ORDER — FENTANYL CITRATE PF 50 MCG/ML IJ SOSY: 25.0000 ug | PREFILLED_SYRINGE | INTRAMUSCULAR | Status: DC | PRN

## 2023-11-20 MED ORDER — IOHEXOL 300 MG/ML  SOLN
INTRAMUSCULAR | Status: DC | PRN
  Administered 2023-11-20: 20 mL

## 2023-11-20 MED ORDER — ONDANSETRON HCL 4 MG/2ML IJ SOLN
INTRAMUSCULAR | Status: DC | PRN
  Administered 2023-11-20: 4 mg via INTRAVENOUS

## 2023-11-20 MED ORDER — PROPOFOL 10 MG/ML IV BOLUS
INTRAVENOUS | Status: DC | PRN
  Administered 2023-11-20: 150 mg via INTRAVENOUS

## 2023-11-20 MED ORDER — LIDOCAINE HCL (PF) 2 % IJ SOLN
INTRAMUSCULAR | Status: AC
  Filled 2023-11-20: qty 5

## 2023-11-20 MED ORDER — CEFAZOLIN SODIUM-DEXTROSE 2-4 GM/100ML-% IV SOLN
2.0000 g | INTRAVENOUS | Status: AC
  Administered 2023-11-20: 2 g via INTRAVENOUS
  Filled 2023-11-20: qty 100

## 2023-11-20 MED ORDER — ONDANSETRON HCL 4 MG/2ML IJ SOLN
INTRAMUSCULAR | Status: AC
  Filled 2023-11-20: qty 2

## 2023-11-20 SURGICAL SUPPLY — 20 items
BAG URINE DRAIN 2000ML AR STRL (UROLOGICAL SUPPLIES) IMPLANT
BAG URO CATCHER STRL LF (MISCELLANEOUS) ×2 IMPLANT
CATH FOLEY 2WAY SLVR 5CC 18FR (CATHETERS) IMPLANT
CATH URETL OPEN END 6FR 70 (CATHETERS) ×2 IMPLANT
CLOTH BEACON ORANGE TIMEOUT ST (SAFETY) ×2 IMPLANT
DRAPE FOOT SWITCH (DRAPES) ×2 IMPLANT
ELECT REM PT RETURN 15FT ADLT (MISCELLANEOUS) IMPLANT
GLOVE BIO SURGEON STRL SZ7.5 (GLOVE) ×2 IMPLANT
GOWN STRL REUS W/ TWL XL LVL3 (GOWN DISPOSABLE) ×2 IMPLANT
GUIDEWIRE STR DUAL SENSOR (WIRE) IMPLANT
KIT TURNOVER KIT A (KITS) ×2 IMPLANT
LOOP CUT BIPOLAR 24F LRG (ELECTROSURGICAL) IMPLANT
MANIFOLD NEPTUNE II (INSTRUMENTS) ×2 IMPLANT
NS IRRIG 1000ML POUR BTL (IV SOLUTION) IMPLANT
PACK CYSTO (CUSTOM PROCEDURE TRAY) ×2 IMPLANT
PAD TELFA 2X3 NADH STRL (GAUZE/BANDAGES/DRESSINGS) IMPLANT
PLUG CATH AND CAP STRL 200 (CATHETERS) IMPLANT
SYRINGE TOOMEY IRRIG 70ML (MISCELLANEOUS) IMPLANT
TUBING CONNECTING 10 (TUBING) ×2 IMPLANT
TUBING UROLOGY SET (TUBING) IMPLANT

## 2023-11-20 NOTE — Anesthesia Preprocedure Evaluation (Addendum)
 Anesthesia Evaluation  Patient identified by MRN, date of birth, ID band Patient awake    Reviewed: Allergy & Precautions, NPO status , Patient's Chart, lab work & pertinent test results  History of Anesthesia Complications Negative for: history of anesthetic complications  Airway Mallampati: II  TM Distance: >3 FB Neck ROM: Full   Comment: Previous grade I view with MAC 3, easy mask Dental  (+) Dental Advisory Given, Partial Upper   Pulmonary neg shortness of breath, neg sleep apnea, neg COPD (patient denies), neg recent URI   Pulmonary exam normal breath sounds clear to auscultation       Cardiovascular (-) hypertension(-) angina (-) Past MI, (-) Cardiac Stents and (-) CABG + dysrhythmias (PAT) + Valvular Problems/Murmurs (mild) MR  Rhythm:Regular Rate:Normal  HLD  TTE 10/30/2021: SUMMARY  No significant change when compared to previous study 10-24-2020.  The left ventricular size is normal.  There is normal left ventricular wall thickness.  LV ejection fraction = 60-65%.  Left ventricular systolic function is normal.  There is mild mitral regurgitation.  There is mild tricuspid regurgitation.  IVC size was mildly dilated.     Neuro/Psych neg Seizures PSYCHIATRIC DISORDERS Anxiety     H/o Chiari malformation    GI/Hepatic Neg liver ROS, hiatal hernia,GERD  Medicated,,IBS, h/o diverticulitis   Endo/Other  negative endocrine ROS    Renal/GU negative Renal ROS   Bladder tumor    Musculoskeletal Osteoporosis    Abdominal   Peds  Hematology negative hematology ROS (+) Lab Results      Component                Value               Date                      WBC                      6.1                 11/18/2023                HGB                      13.8                11/18/2023                HCT                      41.7                11/18/2023                MCV                      96.1                 11/18/2023                PLT                      212                 11/18/2023              Anesthesia Other Findings   Reproductive/Obstetrics  Anesthesia Physical Anesthesia Plan  ASA: 3  Anesthesia Plan: General   Post-op Pain Management: Tylenol  PO (pre-op)*   Induction: Intravenous  PONV Risk Score and Plan: 3 and Ondansetron , Dexamethasone  and Treatment may vary due to age or medical condition  Airway Management Planned: LMA  Additional Equipment:   Intra-op Plan:   Post-operative Plan: Extubation in OR  Informed Consent: I have reviewed the patients History and Physical, chart, labs and discussed the procedure including the risks, benefits and alternatives for the proposed anesthesia with the patient or authorized representative who has indicated his/her understanding and acceptance.     Dental advisory given  Plan Discussed with: CRNA and Anesthesiologist  Anesthesia Plan Comments: (Risks of general anesthesia discussed including, but not limited to, sore throat, hoarse voice, chipped/damaged teeth, injury to vocal cords, nausea and vomiting, allergic reactions, lung infection, heart attack, stroke, and death. All questions answered. )         Anesthesia Quick Evaluation

## 2023-11-20 NOTE — H&P (Signed)
 CC/HPI: CC: History of bladder cancer, frequency, urgency  HPI:  06/07/2021  78 year old female who is the wife of a patient of mine. We had previously discussed her history during one of his appointments and she comes in today to establish care. She has a history of low-grade superficial bladder cancer that was resected on 01/13/2019. Her last cystoscopy was about 6 months ago. She has not had a recurrence. Her urologist retired and she wants to establish care here and is due for a cystoscopy. She also complains about daytime urinary frequency and urgency. She denies urinary incontinence. This has been occurring for a few months. She would like to try Myrbetriq.   12/03/2021  Patient presents today for surveillance cystoscopy. Myrbetriq gave her some nausea. Still has some intermittent problems with frequency/urgency. Does not want any further intervention right now.   05/28/2022  Patient presents today for surveillance cystoscopy.   06/24/2022  Patient underwent transurethral resection of bladder tumor with instillation of gemcitabine . Postoperatively had a large amount of diarrhea. Went to the emergency department and had a CT of the abdomen and pelvis. Felt to have a possible bladder perforation but subsequent cystogram showed an outpouching from the bladder consistent with her resection area that was contained without any extravasation of contrast. She also had some findings of colitis. She was ultimately admitted to the hospital. Ultimately found to have rotavirus. She has recovered for the most part but still has fatigue. 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   07/03/2023  Patient presents today for surveillance cystoscopy.   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 Bilateral retrograde pyelogram was normal. She also underwent instillation of gemcitabine .   08/03/2023: Max is a 78 year old  female who presents today with concerns of feeling a protrusion in her vaginal canal. She noticed this while in the shower the other day. She is also having some pelvic pain and discomfort. She does have a gynecologist in [place]. She denies vaginal bleeding. Her urine is clear today.   11/06/2023  Patient started vaginal estrogen. Doing well with. Presents for surveillance cystoscopy.     ALLERGIES: Simvastatin - ??    MEDICATIONS: GoodSense Aspirin 81 MG Tablet Chewable  Metoprolol  Tartrate 25 MG Tablet  Bentyl   Estradiol 0.1 MG/GM Cream Apply a finger length tip amount around the urethra 2 nights per week  Lexapro  Protonix  40 MG Tablet Delayed Release  Xanax  0.5 MG Tablet     GU PSH: Bladder Instill AntiCA Agent - 06/16/2022 Cystoscopy - 07/03/2023, 03/27/2023, 12/24/2022, 09/25/2022, 05/28/2022, 12/03/2021 Cystoscopy TURBT 2-5 cm - 06/16/2022       PSH Notes: cholecystectomy with gangrene 2013, bladder surgery 2020   NON-GU PSH: Appendectomy (open) Cholecystectomy (open) Remove Tonsils Visit Complexity (formerly GPC1X) - 08/03/2023, 07/24/2023     GU PMH: Bladder Cancer overlapping sites - 08/03/2023, - 07/24/2023, - 07/03/2023, - 03/27/2023, - 12/24/2022, - 09/25/2022, - 06/24/2022, - 05/28/2022, - 12/03/2021, - 2023 Suprapubic pain - 08/03/2023 Urinary Frequency - 2023 Urinary Urgency - 2023 History of bladder cancer    NON-GU PMH: Anxiety GERD    FAMILY HISTORY: 2 daughters - Other Heart Disease - Mother prostate cancer in father - Father    Notes: 2 daughters   SOCIAL HISTORY: Marital Status: Married Preferred Language: English Current Smoking Status: Patient does not smoke anymore.   Tobacco Use Assessment Completed: Used Tobacco in last 30 days? Has never drank.  Does not drink caffeine. Patient's  occupation is/was retired.    REVIEW OF SYSTEMS:    GU Review Female:   Patient denies frequent urination, hard to postpone urination, burning /pain with urination, get up  at night to urinate, leakage of urine, stream starts and stops, trouble starting your stream, have to strain to urinate, and being pregnant.  Gastrointestinal (Upper):   Patient denies nausea, vomiting, and indigestion/ heartburn.  Gastrointestinal (Lower):   Patient denies diarrhea and constipation.  Constitutional:   Patient denies fever, night sweats, weight loss, and fatigue.  Skin:   Patient denies skin rash/ lesion and itching.  Eyes:   Patient denies blurred vision and double vision.  Ears/ Nose/ Throat:   Patient denies sore throat and sinus problems.  Hematologic/Lymphatic:   Patient denies swollen glands and easy bruising.  Cardiovascular:   Patient denies leg swelling and chest pains.  Respiratory:   Patient denies cough and shortness of breath.  Endocrine:   Patient denies excessive thirst.  Musculoskeletal:   Patient denies back pain and joint pain.  Neurological:   Patient denies headaches and dizziness.  Psychologic:   Patient denies depression and anxiety.   VITAL SIGNS: None   MULTI-SYSTEM PHYSICAL EXAMINATION:    Constitutional: Well-nourished. No physical deformities. Normally developed. Good grooming.  Gastrointestinal: No mass, no tenderness, no rigidity, non obese abdomen.  Eyes: Normal conjunctivae. Normal eyelids.  Musculoskeletal: Normal gait and station of head and neck.     Complexity of Data:  Source Of History:  Patient  Records Review:   Previous Doctor Records, Previous Patient Records  Urine Test Review:   Urinalysis   PROCEDURES:         Flexible Cystoscopy - 52000  Risks, benefits, and the potential complications of the procedure were discussed with the patient including infection, bleeding, voiding discomfort, urinary retention, etc. All questions were answered. Consent was obtained. Sterile technique and intraurethral analgesia were used.  Meatus:  Normal size. Normal location. Normal condition.  Urethra:  Normal urethra  Ureteral Orifices:   Normal location. Normal size. Normal shape. Effluxed clear urine.  Bladder:  No trabeculation. Tiny approximately 0.5 cm bladder tumor on the left lateral wall lateral to the ureteral orifice. Had another tiny 0.5 cm bladder tumor just prior to the ureteral orifice on the left      The procedure was well-tolerated and without complications. Instructions were given to call the office if she developed any problems. The patient stated that she understood these instructions.         Urinalysis w/Scope Dipstick Dipstick Cont'd Micro  Color: Yellow Bilirubin: Neg mg/dL WBC/hpf: 0 - 5/hpf  Appearance: Clear Ketones: Neg mg/dL RBC/hpf: NS (Not Seen)  Specific Gravity: 1.015 Blood: Neg ery/uL Bacteria: Few (10-25/hpf)  pH: 7.0 Protein: Trace mg/dL Cystals: NS (Not Seen)  Glucose: Neg mg/dL Urobilinogen: 0.2 mg/dL Casts: NS (Not Seen)    Nitrites: Neg Trichomonas: Not Present    Leukocyte Esterase: Trace leu/uL Mucous: Not Present      Epithelial Cells: 0 - 5/hpf      Yeast: NS (Not Seen)      Sperm: Not Present    ASSESSMENT:      ICD-10 Details  1 GU:   Bladder Cancer overlapping sites - C67.8 Chronic, Stable   PLAN:           Orders Labs Urine Culture          Document Letter(s):  Created for Patient: Clinical Summary  Notes:   We will proceed with cystoscopy with bladder biopsy and fulguration with bilateral retrograde pyelogram and instillation of gemcitabine . Risk and benefits discussed.   CC: Dr. Thurmond    Signed by Sherwood Edison, III, M.D. on 11/06/23 at 11:01 AM (EDT

## 2023-11-20 NOTE — Anesthesia Procedure Notes (Signed)
 Procedure Name: LMA Insertion Date/Time: 11/20/2023 12:39 PM  Performed by: Zulema Leita PARAS, CRNAPre-anesthesia Checklist: Patient identified, Emergency Drugs available, Suction available and Patient being monitored Patient Re-evaluated:Patient Re-evaluated prior to induction Oxygen Delivery Method: Circle system utilized Preoxygenation: Pre-oxygenation with 100% oxygen Induction Type: IV induction LMA: LMA with gastric port inserted LMA Size: 4.0 Number of attempts: 1 Placement Confirmation: positive ETCO2 and breath sounds checked- equal and bilateral Tube secured with: Tape Dental Injury: Teeth and Oropharynx as per pre-operative assessment

## 2023-11-20 NOTE — Transfer of Care (Signed)
 Immediate Anesthesia Transfer of Care Note  Patient: Kara Reynolds  Procedure(s) Performed: CYSTOSCOPY, WITH BLADDER FULGURATION CYSTOSCOPY, WITH RETROGRADE PYELOGRAM (Bilateral) INSTILLATION, BLADDER  Patient Location: PACU  Anesthesia Type:General  Level of Consciousness: awake, alert , and oriented  Airway & Oxygen Therapy: Patient Spontanous Breathing and Patient connected to face mask oxygen  Post-op Assessment: Report given to RN and Post -op Vital signs reviewed and stable  Post vital signs: Reviewed and stable  Last Vitals:  Vitals Value Taken Time  BP    Temp    Pulse 71 11/20/23 13:18  Resp 9 11/20/23 13:18  SpO2 100 % 11/20/23 13:18  Vitals shown include unfiled device data.  Last Pain:  Vitals:   11/20/23 1029  TempSrc:   PainSc: 0-No pain      Patients Stated Pain Goal: 5 (11/20/23 1023)  Complications: No notable events documented.

## 2023-11-20 NOTE — Discharge Instructions (Addendum)

## 2023-11-20 NOTE — Op Note (Signed)
 Operative Note  Preoperative diagnosis:  1.  Bladder tumor  Postoperative diagnosis: 1.  Bladder tumor--small  Procedure(s): 1.  Cystoscopy with bilateral retrograde pyelogram 2.  Transurethral biopsy and fulguration/destruction of bladder tumor--small 3.  Intravesical instillation of gemcitabine   Surgeon: Sherwood Edison, MD  Assistants: None  Anesthesia: General  Complications: None immediate  EBL: Minimal  Specimens: 1.  Bladder tumor  Drains/Catheters: 1.  16 French Foley catheter  Intraoperative findings: 1.  Normal urethra 2.  Approximately 0.5 to 1 cm small bladder lesion appeared superficial and papillary on the left lateral wall lateral to the bladder neck that was biopsied and fulgurated.  She also had a tiny lesion just lateral to the ureteral orifice that was fulgurated.  3.  Right retrograde pyelogram without any filling defect or hydronephrosis  4.  Left retrograde pyelogram without any filling defect or hydronephrosis  Indication: 78 year old female with a bladder tumor and history of low-grade bladder cancer presents for the previously mentioned operation.  Description of procedure:  The patient was identified and consent was obtained.  The patient was taken to the operating room and placed in the supine position.  The patient was placed under general anesthesia.  Perioperative antibiotics were administered.  The patient was placed in dorsal lithotomy.  Patient was prepped and draped in a standard sterile fashion and a timeout was performed.  A 21 French rigid cystoscope was advanced into the urethra and into the bladder.  Complete cystoscopy was performed with findings noted above.  The left ureter was intubated with an open-ended ureteral catheter and a retrograde pyelogram was performed with no abnormal findings.  Same was performed on the right again with no abnormal findings.  Cold cup biopsies were used to biopsy the left lateral wall lesion and passed off  for specimen.  I fulgurated the biopsy bed and there was also a tiny lesion lateral to the left ureteral orifice that I fulgurated.  The ureteral orifice was not fulgurated.  There was no evidence of any active bleeding and no perforation.  I withdrew the scope and placed a 16 French Foley catheter.  This concluded the operation.  Patient tolerated the procedure well and was stable postoperatively.  In the PACU, I instilled gemcitabine  into the bladder where it remained for proxy 1 hour prior to proper disposal.  Plan: Follow-up in 1 week for pathology review

## 2023-11-20 NOTE — Anesthesia Postprocedure Evaluation (Signed)
 Anesthesia Post Note  Patient: Kara Reynolds  Procedure(s) Performed: CYSTOSCOPY, WITH BLADDER FULGURATION CYSTOSCOPY, WITH RETROGRADE PYELOGRAM (Bilateral) INSTILLATION, BLADDER     Patient location during evaluation: PACU Anesthesia Type: General Level of consciousness: awake Pain management: pain level controlled Vital Signs Assessment: post-procedure vital signs reviewed and stable Respiratory status: spontaneous breathing, nonlabored ventilation and respiratory function stable Cardiovascular status: blood pressure returned to baseline and stable Postop Assessment: no apparent nausea or vomiting Anesthetic complications: no   No notable events documented.  Last Vitals:  Vitals:   11/20/23 1445 11/20/23 1500  BP: (!) 140/66 (!) 148/71  Pulse: (!) 59 (!) 58  Resp: 14 18  Temp:  (!) 36.3 C  SpO2: 99% 97%    Last Pain:  Vitals:   11/20/23 1500  TempSrc:   PainSc: 0-No pain                 Delon Aisha Arch

## 2023-11-21 ENCOUNTER — Encounter (HOSPITAL_COMMUNITY): Payer: Self-pay | Admitting: Urology

## 2023-11-24 LAB — SURGICAL PATHOLOGY

## 2024-04-14 ENCOUNTER — Encounter (HOSPITAL_BASED_OUTPATIENT_CLINIC_OR_DEPARTMENT_OTHER): Payer: Self-pay

## 2024-04-14 ENCOUNTER — Other Ambulatory Visit (HOSPITAL_BASED_OUTPATIENT_CLINIC_OR_DEPARTMENT_OTHER): Payer: Self-pay

## 2024-04-14 ENCOUNTER — Ambulatory Visit (HOSPITAL_BASED_OUTPATIENT_CLINIC_OR_DEPARTMENT_OTHER)
Admission: EM | Admit: 2024-04-14 | Discharge: 2024-04-14 | Disposition: A | Discharge status: Home / Self Care | Attending: Family Medicine | Admitting: Family Medicine

## 2024-04-14 DIAGNOSIS — R051 Acute cough: Secondary | ICD-10-CM

## 2024-04-14 DIAGNOSIS — J069 Acute upper respiratory infection, unspecified: Secondary | ICD-10-CM

## 2024-04-14 LAB — POC COVID19/FLU A&B COMBO
Covid Antigen, POC: NEGATIVE
Influenza A Antigen, POC: NEGATIVE
Influenza B Antigen, POC: NEGATIVE

## 2024-04-14 MED ORDER — PROMETHAZINE-DM 6.25-15 MG/5ML PO SYRP
5.0000 mL | ORAL_SOLUTION | Freq: Four times a day (QID) | ORAL | 0 refills | Status: DC | PRN
  Filled 2024-04-14: qty 118, 6d supply, fill #0

## 2024-04-14 MED ORDER — PREDNISONE 20 MG PO TABS
20.0000 mg | ORAL_TABLET | Freq: Every day | ORAL | 0 refills | Status: DC
  Filled 2024-04-14: qty 5, 5d supply, fill #0

## 2024-04-14 MED ORDER — ALBUTEROL SULFATE HFA 108 (90 BASE) MCG/ACT IN AERS
2.0000 | INHALATION_SPRAY | RESPIRATORY_TRACT | 0 refills | Status: AC | PRN
  Filled 2024-04-14: qty 6.7, 20d supply, fill #0

## 2024-04-14 NOTE — ED Triage Notes (Addendum)
 Pt c/o of cough,chest tightness when coughing, rib cage pain, shortness of breathe,dizzy started yesterday. Denies fever/ear pain. Pt reports when walking its hard to catch her breath. Pt has taken allegra,tylenol  for symptoms.

## 2024-04-14 NOTE — Discharge Instructions (Addendum)
 Acute viral upper respiratory infection versus early viral bronchitis: Rapid flu and COVID are negative.  Oxygen saturation is 95-96% on room air.   Prednisone  20 mg once daily in the morning.  Start the prednisone  on 04/15/2024.   Promethazine  DM, 5 mL, every 6 hours if needed for cough.   Albuterol  inhaler, 2 puffs, every 4 hours if needed for spasmodic cough or wheezing or shortness of breath. Get plenty of fluids and rest.  See below for signs and symptoms of worsening condition and reasons to return here or go to an emergency room.  Follow-up with primary care or return here if symptoms do not improve, worsen or new symptoms occur.  Contact a health care provider if: Your symptoms last for 10 days or longer. Your symptoms get worse over time. You have severe sinus pain in your face or forehead. The glands in your jaw or neck become very swollen. You have shortness of breath.  Get help right away if you: Feel pain or pressure in your chest. Have trouble breathing. Faint or feel like you will faint. Have severe and persistent vomiting. Feel confused or disoriented. These symptoms may represent a serious problem that is an emergency. Do not wait to see if the symptoms will go away. Get medical help right away. Call your local emergency services (911 in the [place] Do not drive yourself to the hospital.

## 2024-04-14 NOTE — ED Provider Notes (Signed)
 [facility]    CSN: [id number] Arrival date & time: 04/14/24  1400      History   Chief Complaint Chief Complaint  Patient presents with   Cough   Shortness of Breath    HPI Kara Reynolds is a 78 y.o. female.   Patient and her husband report that she has had runny nose, cough and head congestion since approximately 04/05/2024 or earlier.  It was just a clear drippy nose and a cough initially.  On 04/12/24, she developed soreness in her ribs, shortness of breath and dizziness.  She fell at home around 03/28/2024 when she was going under her home to get some Christmas decorations.  She does not know if she hit her head or passed out or what happened but her husband found her just a minute or so after she fell getting up off the ground under the house.  She has had some rib soreness since that event but it seems much worse in the last 24 hours and mainly hurts when she coughs.  She has taken OTC Allegra and acetaminophen  for the runny nose and cough with minimal relief.   Cough Associated symptoms: chest pain (Anterior lower chest wall pain with coughing.), rhinorrhea and shortness of breath   Associated symptoms: no chills, no ear pain, no fever, no rash and no sore throat   Shortness of Breath Associated symptoms: chest pain (Anterior lower chest wall pain with coughing.) and cough   Associated symptoms: no abdominal pain, no ear pain, no fever, no rash, no sore throat and no vomiting     Past Medical History:  Diagnosis Date   Acute cystitis without hematuria 07/03/2016   Anxiety 06/29/2015   Last Assessment & Plan:  Needs refill on this does not use it daily at all , but it does help her and she would like to continue with this and never fills this consistently    Bladder cancer [facility])    Bladder cancer   Chest discomfort 10/29/2016   Colon polyp    Diverticulitis    Dysrhythmia    Encounter for screening colonoscopy 01/03/2016   GERD (gastroesophageal reflux  disease) 06/29/2015   Last Assessment & Plan:  We discussed the side effects of the PPIs and use of zantac alternating if she would like, and she feels the PPI is needed for her GERD and does helps her, discussed bone loss as well as memory issues with the class as well   History of hiatal hernia    Hypercholesteremia 06/29/2015   Last Assessment & Plan:  Update her fasting labs for her today and discussed diet which she and her husband are working on more with her weight as well   IBS (irritable bowel syndrome)    Leaky heart valve    per patient   Malaise and fatigue 07/02/2015   Last Assessment & Plan:  Off and on for her, she tries to remain active is retired now worked at [employer] and has had blood exposure and she is going to have the hepatitis C panel drawn for this purpose   Mild mitral regurgitation    Mitral 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 She isnt willing.  Ordering a new dexa.  Last Assessment & Plan:  Update her vitamin d level, she is not on meds for this and had her last  exam this year in februray   Other emphysema (HCC) 07/03/2016   Palpitations 10/29/2016   Takes metoprolol   PRN   Paroxysmal atrial tachycardia 10/29/2016   Postmenopausal 06/29/2015   Last Assessment & Plan:  Not using any hormones, she feels she is doing well and has her mammograms done at [place]   Vitamin D deficiency 06/29/2015   Last Assessment & Plan:  Update her level for her today and see where she is at    Patient Active Problem List   Diagnosis Date Noted   Colitis 06/20/2022   Chest pain 03/16/2017   Chest discomfort 10/29/2016   Palpitations 10/29/2016   Paroxysmal atrial tachycardia 10/29/2016   Acute cystitis without hematuria 07/03/2016   Other emphysema (HCC) 07/03/2016   Encounter for screening colonoscopy 01/03/2016   Malaise and fatigue 07/02/2015   Anxiety  06/29/2015   GERD (gastroesophageal reflux disease) 06/29/2015   Hypercholesteremia 06/29/2015   Mitral valve disorder 06/29/2015   Osteoporosis 06/29/2015   Postmenopausal 06/29/2015   Vitamin D deficiency 06/29/2015    Past Surgical History:  Procedure Laterality Date   APPENDECTOMY     teenager   BLADDER INSTILLATION N/A 11/20/2023   Procedure: INSTILLATION, BLADDER;  Surgeon: Carolee Sherwood JONETTA DOUGLAS, MD;  Location: [facility];  Service: Urology;  Laterality: N/A;  INSTILL GEMCITABINE    CHOLECYSTECTOMY     CYSTOSCOPY W/ RETROGRADES Bilateral 11/20/2023   Procedure: CYSTOSCOPY, WITH RETROGRADE PYELOGRAM;  Surgeon: Carolee Sherwood JONETTA DOUGLAS, MD;  Location: [facility];  Service: Urology;  Laterality: Bilateral;  INSTILL GEMCITABINE    CYSTOSCOPY W/ URETERAL STENT PLACEMENT Bilateral 06/16/2022   Procedure: BILATERAL RETROGRADE PYELOGRAM;  Surgeon: Carolee Sherwood JONETTA DOUGLAS, MD;  Location: [facility];  Service: Urology;  Laterality: Bilateral;   CYSTOSCOPY WITH FULGERATION N/A 11/20/2023   Procedure: CYSTOSCOPY, WITH BLADDER FULGURATION;  Surgeon: Carolee Sherwood JONETTA DOUGLAS, MD;  Location: [facility];  Service: Urology;  Laterality: N/A;   ENDOMETRIAL BIOPSY     per patient will be 10-12-23   left thumb surgery      TONSILLECTOMY     early 20's   TRANSURETHRAL RESECTION OF BLADDER TUMOR  01/13/2019    OB History   No obstetric history on file.      Home Medications    Prior to Admission medications   Medication Sig Start Date End Date Taking? Authorizing Provider  albuterol (VENTOLIN HFA) 108 (90 Base) MCG/ACT inhaler Inhale 2 puffs into the lungs every 4 (four) hours as needed for wheezing or shortness of breath. 04/14/24  Yes Ival Domino, FNP  metoprolol  tartrate (LOPRESSOR ) 25 MG tablet Take 2 tablets (50 mg total) daily as needed by mouth. Patient taking differently: Take 25 mg by mouth daily as needed (palpitations). 03/18/17  Yes Monetta Redell PARAS, MD  pantoprazole  (PROTONIX ) 40 MG tablet Take 1 tablet (40 mg total) by  mouth daily. 10/01/23  Yes Charlanne Groom, MD  predniSONE (DELTASONE) 20 MG tablet Take 1 tablet (20 mg total) by mouth daily with breakfast for 5 days. 04/14/24 04/19/24 Yes Ival Domino, FNP  promethazine -dextromethorphan (PROMETHAZINE -DM) 6.25-15 MG/5ML syrup Take 5 mLs by mouth 4 (four) times daily as needed for cough. Do not use and drive - May make drowsy. 04/14/24  Yes Ival Domino, FNP  acetaminophen  (TYLENOL ) 500 MG tablet Take 500 mg by mouth every 6 (six) hours as needed (pain.).    [provider]  ALPRAZolam  (XANAX ) 0.5 MG tablet Take 0.5 mg by mouth at bedtime.    [provider]  dicyclomine  (BENTYL ) 10 MG capsule Take 1 capsule (10 mg total) by mouth 2 (two) times daily as needed for spasms. 10/01/23   Charlanne Groom, MD  escitalopram (LEXAPRO) 10 MG tablet Take 10 mg by mouth daily as needed (mood regulation/anxiety). 10/16/23   [provider]  polyethylene glycol (MIRALAX / GLYCOLAX) 17 g packet Take 17 g by mouth in the morning.    [provider]  Wheat Dextrin (BENEFIBER ON THE GO) PACK Take 1 packet by mouth in the morning.    [provider]    Family History Family History  Problem Relation Age of Onset   Prostate cancer Father        died about 15 years ago   Stroke Maternal Grandfather    Diabetes Child    Colon cancer Neg Hx    Esophageal cancer Neg Hx    Liver disease Neg Hx     Social History Social History   Tobacco Use   Smoking status: Never    Passive exposure: Never   Smokeless tobacco: Never  Vaping Use   Vaping status: Never Used  Substance Use Topics   Alcohol use: No   Drug use: Never     Allergies   Pseudoephedrine   Review of Systems Review of Systems  Constitutional:  Negative for chills and fever.  HENT:  Positive for congestion, postnasal drip and rhinorrhea. Negative for ear pain and sore throat.   Eyes:  Negative for pain and visual disturbance.  Respiratory:  Positive for cough and  shortness of breath.   Cardiovascular:  Positive for chest pain (Anterior lower chest wall pain with coughing.). Negative for palpitations.  Gastrointestinal:  Negative for abdominal pain, constipation, diarrhea, nausea and vomiting.  Genitourinary:  Negative for dysuria and hematuria.  Musculoskeletal:  Negative for arthralgias and back pain.  Skin:  Negative for color change and rash.  Neurological:  Negative for seizures and syncope.  All other systems reviewed and are negative.    Physical Exam Triage Vital Signs ED Triage Vitals  Encounter Vitals Group     BP 04/14/24 1419 (!) 150/72     Girls Systolic BP Percentile --      Girls Diastolic BP Percentile --      Boys Systolic BP Percentile --      Boys Diastolic BP Percentile --      Pulse Rate 04/14/24 1419 71     Resp 04/14/24 1419 18     Temp 04/14/24 1419 98.2 F (36.8 C)     Temp Source 04/14/24 1419 Oral     SpO2 04/14/24 1419 95 %     Weight --      Height --      Head Circumference --      Peak Flow --      Pain Score 04/14/24 1415 7     Pain Loc --      Pain Education --      Exclude from Growth Chart --    No data found.  Updated Vital Signs BP (!) 150/72 (BP Location: Right Arm)   Pulse 71   Temp 98.2 F (36.8 C) (Oral)   Resp 18   SpO2 95%   Visual Acuity Right Eye Distance:   Left Eye Distance:   Bilateral Distance:    Right Eye Near:   Left Eye Near:    Bilateral Near:     Physical Exam Vitals and nursing note reviewed.  Constitutional:  General: She is not in acute distress.    Appearance: She is well-developed. She is ill-appearing. She is not toxic-appearing or diaphoretic.  HENT:     Head: Normocephalic and atraumatic.     Right Ear: Hearing, tympanic membrane, ear canal and external ear normal.     Left Ear: Hearing, tympanic membrane, ear canal and external ear normal.     Nose: Mucosal edema, congestion and rhinorrhea present. Rhinorrhea is clear.     Right Sinus: No  maxillary sinus tenderness or frontal sinus tenderness.     Left Sinus: No maxillary sinus tenderness or frontal sinus tenderness.     Mouth/Throat:     Lips: Pink.     Mouth: Mucous membranes are moist.     Pharynx: Uvula midline. No oropharyngeal exudate or posterior oropharyngeal erythema.     Tonsils: No tonsillar exudate.  Eyes:     Conjunctiva/sclera: Conjunctivae normal.     Pupils: Pupils are equal, round, and reactive to light.  Cardiovascular:     Rate and Rhythm: Normal rate and regular rhythm.     Heart sounds: S1 normal and S2 normal. No murmur heard. Pulmonary:     Effort: Pulmonary effort is normal. No respiratory distress.     Breath sounds: Examination of the right-upper field reveals wheezing. Examination of the left-upper field reveals wheezing. Wheezing (Rare inspiratory wheeze) present. No decreased breath sounds, rhonchi or rales.  Abdominal:     General: Bowel sounds are normal.     Palpations: Abdomen is soft.     Tenderness: There is no abdominal tenderness.  Musculoskeletal:        General: No swelling.     Cervical back: Neck supple.  Lymphadenopathy:     Head:     Right side of head: No submental, submandibular, tonsillar, preauricular or posterior auricular adenopathy.     Left side of head: No submental, submandibular, tonsillar, preauricular or posterior auricular adenopathy.     Cervical: No cervical adenopathy.     Right cervical: No superficial cervical adenopathy.    Left cervical: No superficial cervical adenopathy.  Skin:    General: Skin is warm and dry.     Capillary Refill: Capillary refill takes less than 2 seconds.     Findings: No rash.  Neurological:     Mental Status: She is alert and oriented to person, place, and time.  Psychiatric:        Mood and Affect: Mood normal.      UC Treatments / Results  Labs (all labs ordered are listed, but only abnormal results are displayed) Labs Reviewed  POC COVID19/FLU A&B COMBO - Normal     EKG   Radiology No results found.  Procedures Procedures (including critical care time)  Medications Ordered in UC Medications - No data to display  Initial Impression / Assessment and Plan / UC Course  I have reviewed the triage vital signs and the nursing notes.  Pertinent labs & imaging results that were available during my care of the patient were reviewed by me and considered in my medical decision making (see chart for details).  Plan of Care (see discharge instructions for additional patient precautions and education): Acute viral upper respiratory infection versus early viral bronchitis: Rapid flu and COVID are negative.  Oxygen saturation is 95-96% on room air.   Prednisone  20 mg once daily in the morning.  Start the prednisone  on 04/15/2024.   Promethazine  DM, 5 mL, every 6 hours if needed for cough.  Albuterol  inhaler, 2 puffs, every 4 hours if needed for spasmodic cough or wheezing or shortness of breath. Get plenty of fluids and rest. See discharge instruction for signs and symptoms of worsening condition and reasons to return here or go to an emergency room. Follow-up with primary care or return here if symptoms do not improve, worsen or new symptoms occur.  I reviewed the plan of care with the patient and/or the patient's guardian.  The patient and/or guardian had time to ask questions and acknowledged that the questions were answered.  Final Clinical Impressions(s) / UC Diagnoses   Final diagnoses:  Acute cough  Viral URI     Discharge Instructions      Acute viral upper respiratory infection versus early viral bronchitis: Rapid flu and COVID are negative.  Oxygen saturation is 95-96% on room air.   Prednisone  20 mg once daily in the morning.  Start the prednisone  on 04/15/2024.   Promethazine  DM, 5 mL, every 6 hours if needed for cough.   Albuterol  inhaler, 2 puffs, every 4 hours if needed for spasmodic cough or wheezing or shortness of breath. Get  plenty of fluids and rest.  See below for signs and symptoms of worsening condition and reasons to return here or go to an emergency room.  Follow-up with primary care or return here if symptoms do not improve, worsen or new symptoms occur.  Contact a health care provider if: Your symptoms last for 10 days or longer. Your symptoms get worse over time. You have severe sinus pain in your face or forehead. The glands in your jaw or neck become very swollen. You have shortness of breath.  Get help right away if you: Feel pain or pressure in your chest. Have trouble breathing. Faint or feel like you will faint. Have severe and persistent vomiting. Feel confused or disoriented. These symptoms may represent a serious problem that is an emergency. Do not wait to see if the symptoms will go away. Get medical help right away. Call your local emergency services (911 in the [place] Do not drive yourself to the hospital.     ED Prescriptions     Medication Sig Dispense Auth. Provider   predniSONE  (DELTASONE ) 20 MG tablet Take 1 tablet (20 mg total) by mouth daily with breakfast for 5 days. 5 tablet Wilmoth Rasnic, FNP   promethazine -dextromethorphan (PROMETHAZINE -DM) 6.25-15 MG/5ML syrup Take 5 mLs by mouth 4 (four) times daily as needed for cough. Do not use and drive - May make drowsy. 118 mL Ival Domino, FNP   albuterol  (VENTOLIN  HFA) 108 (90 Base) MCG/ACT inhaler Inhale 2 puffs into the lungs every 4 (four) hours as needed for wheezing or shortness of breath. 1 each Ival Domino, FNP      PDMP not reviewed this encounter.   Ival Domino, FNP 04/14/24 [id number]

## 2024-04-19 ENCOUNTER — Ambulatory Visit (INDEPENDENT_AMBULATORY_CARE_PROVIDER_SITE_OTHER): Admit: 2024-04-19 | Discharge: 2024-04-19 | Disposition: A | Admitting: Radiology

## 2024-04-19 ENCOUNTER — Ambulatory Visit (HOSPITAL_BASED_OUTPATIENT_CLINIC_OR_DEPARTMENT_OTHER)
Admission: EM | Admit: 2024-04-19 | Discharge: 2024-04-19 | Disposition: A | Discharge status: Home / Self Care | Attending: Family Medicine | Admitting: Family Medicine

## 2024-04-19 ENCOUNTER — Other Ambulatory Visit (HOSPITAL_BASED_OUTPATIENT_CLINIC_OR_DEPARTMENT_OTHER): Payer: Self-pay

## 2024-04-19 ENCOUNTER — Encounter (HOSPITAL_BASED_OUTPATIENT_CLINIC_OR_DEPARTMENT_OTHER): Payer: Self-pay

## 2024-04-19 DIAGNOSIS — J0140 Acute pansinusitis, unspecified: Secondary | ICD-10-CM | POA: Diagnosis not present

## 2024-04-19 DIAGNOSIS — R051 Acute cough: Secondary | ICD-10-CM

## 2024-04-19 DIAGNOSIS — R0602 Shortness of breath: Secondary | ICD-10-CM | POA: Diagnosis not present

## 2024-04-19 DIAGNOSIS — J208 Acute bronchitis due to other specified organisms: Secondary | ICD-10-CM | POA: Diagnosis not present

## 2024-04-19 LAB — POCT RESPIRATORY SYNCYTIAL VIRUS: RSV Antigen, POC: NEGATIVE

## 2024-04-19 MED ORDER — CEFTRIAXONE SODIUM 1 G IJ SOLR
1.0000 g | Freq: Once | INTRAMUSCULAR | Status: AC
  Administered 2024-04-19: 1 g via INTRAMUSCULAR

## 2024-04-19 MED ORDER — PROMETHAZINE-DM 6.25-15 MG/5ML PO SYRP
5.0000 mL | ORAL_SOLUTION | Freq: Four times a day (QID) | ORAL | 0 refills | Status: AC | PRN
  Filled 2024-04-19: qty 118, 6d supply, fill #0

## 2024-04-19 MED ORDER — METHYLPREDNISOLONE ACETATE 80 MG/ML IJ SUSP
80.0000 mg | Freq: Once | INTRAMUSCULAR | Status: AC
  Administered 2024-04-19: 80 mg via INTRAMUSCULAR

## 2024-04-19 MED ORDER — IPRATROPIUM-ALBUTEROL 0.5-2.5 (3) MG/3ML IN SOLN
3.0000 mL | Freq: Once | RESPIRATORY_TRACT | Status: AC
  Administered 2024-04-19: 3 mL via RESPIRATORY_TRACT

## 2024-04-19 MED ORDER — DOXYCYCLINE HYCLATE 100 MG PO CAPS
100.0000 mg | ORAL_CAPSULE | Freq: Two times a day (BID) | ORAL | 0 refills | Status: AC
  Filled 2024-04-19: qty 20, 10d supply, fill #0

## 2024-04-19 NOTE — Discharge Instructions (Addendum)
 Sinusitis and bronchitis with cough and shortness of breath: Rapid RSV was negative.  Chest x-ray is negative for pneumonia but patient advised that given her exam findings and symptoms, she could have early pneumonia and it might not show up on the films until its progressed further.  Will treat with ceftriaxone  at 1000 mg IM now.  Will treat with Depo-Medrol  80 mg steroid injection now.  Doxycycline  100 mg twice daily for 10 days.  Get plenty of fluids and rest.  She had a huge improvement in breath sounds and oxygenation with the DuoNeb treatment.  Use the albuterol  inhaler, 2 puffs, every 4 hours if needed for cough or wheezing.  Continue Promethazine  DM, 5 mL, every 6 hours if needed for cough.  Reviewed signs and symptoms of worsening condition and reasons to return here or go to an emergency room (see below for more information).    Needs to make an appointment with primary care and get rechecked in 2 to 3 weeks or may return here if needed.  Get help right away if you: Cough up blood. Feel pain in your chest. Have severe shortness of breath. Faint or keep feeling like you are going to faint. Have a severe headache. Have a fever or chills that get worse. These symptoms may represent a serious problem that is an emergency. Do not wait to see if the symptoms will go away. Get medical help right away. Call your local emergency services (911 in the [place] Do not drive yourself to the hospital.

## 2024-04-19 NOTE — ED Provider Notes (Addendum)
 [facility]    CSN: [id number] Arrival date & time: 04/19/24  0901      History   Chief Complaint Chief Complaint  Patient presents with   Cough   Shortness of Breath    HPI Kara Reynolds is a 78 y.o. female.   Patient was seen on 04/14/24. She had a runny nose, cough and head congestion since approximately 04/05/2024 or earlier.  It was just a clear drippy nose and a cough initially.  On 04/12/24, she developed soreness in her ribs, shortness of breath and dizziness.  She fell at home around 03/28/2024 when she was going under her home to get some Christmas decorations.  She does not know if she hit her head or passed out or what happened but her husband found her just a minute or so after she fell getting up off the ground under the house.  Her oxygenation during the visit was 96% on room air and she had breath sounds throughout although she did have some wheezing.  She was treated for a viral upper respiratory infection versus early viral bronchitis.  She was treated with prednisone  20 mg daily for 5 days, Promethazine  DM, 5 mL, every 6 hours if needed for cough and given an albuterol  inhaler with spacer, 2 puffs, every 4 hours if needed for wheezing.  She presents today reporting that she completed all the prednisone .  She still has cough syrup and the inhaler.  She feels worse not better with more chest congestion, shortness of breath and feeling like she cannot breathe well.  She is now coughing up a greenish sputum..  Denies ear pain,fever, sore throat, nausea, vomiting, constipation, diarrhea.   Cough Associated symptoms: rhinorrhea, shortness of breath and wheezing   Associated symptoms: no chest pain, no chills, no ear pain, no fever, no rash and no sore throat   Shortness of Breath Associated symptoms: cough and wheezing   Associated symptoms: no abdominal pain, no chest pain, no ear pain, no fever, no rash, no sore throat and no vomiting     Past Medical History:   Diagnosis Date   Acute cystitis without hematuria 07/03/2016   Anxiety 06/29/2015   Last Assessment & Plan:  Needs refill on this does not use it daily at all , but it does help her and she would like to continue with this and never fills this consistently    Bladder cancer [facility])    Bladder cancer   Chest discomfort 10/29/2016   Colon polyp    Diverticulitis    Dysrhythmia    Encounter for screening colonoscopy 01/03/2016   GERD (gastroesophageal reflux disease) 06/29/2015   Last Assessment & Plan:  We discussed the side effects of the PPIs and use of zantac alternating if she would like, and she feels the PPI is needed for her GERD and does helps her, discussed bone loss as well as memory issues with the class as well   History of hiatal hernia    Hypercholesteremia 06/29/2015   Last Assessment & Plan:  Update her fasting labs for her today and discussed diet which she and her husband are working on more with her weight as well   IBS (irritable bowel syndrome)    Leaky heart valve    per patient   Malaise and fatigue 07/02/2015   Last Assessment & Plan:  Off and on for her, she tries to remain active is retired now worked at [employer] and has had blood exposure and  she is going to have the hepatitis C panel drawn for this purpose   Mild mitral regurgitation    Mitral 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 She isnt willing.  Ordering a new dexa.  Last Assessment & Plan:  Update her vitamin d level, she is not on meds for this and had her last exam this year in februray   Other emphysema (HCC) 07/03/2016   Palpitations 10/29/2016   Takes metoprolol   PRN   Paroxysmal atrial tachycardia 10/29/2016   Postmenopausal 06/29/2015   Last Assessment & Plan:  Not using any hormones, she feels she is doing well and has her mammograms done at [place]   Vitamin D deficiency  06/29/2015   Last Assessment & Plan:  Update her level for her today and see where she is at    Patient Active Problem List   Diagnosis Date Noted   Colitis 06/20/2022   Chest pain 03/16/2017   Chest discomfort 10/29/2016   Palpitations 10/29/2016   Paroxysmal atrial tachycardia 10/29/2016   Acute cystitis without hematuria 07/03/2016   Other emphysema (HCC) 07/03/2016   Encounter for screening colonoscopy 01/03/2016   Malaise and fatigue 07/02/2015   Anxiety 06/29/2015   GERD (gastroesophageal reflux disease) 06/29/2015   Hypercholesteremia 06/29/2015   Mitral valve disorder 06/29/2015   Osteoporosis 06/29/2015   Postmenopausal 06/29/2015   Vitamin D deficiency 06/29/2015    Past Surgical History:  Procedure Laterality Date   APPENDECTOMY     teenager   BLADDER INSTILLATION N/A 11/20/2023   Procedure: INSTILLATION, BLADDER;  Surgeon: Carolee Sherwood JONETTA DOUGLAS, MD;  Location: [facility];  Service: Urology;  Laterality: N/A;  INSTILL GEMCITABINE    CHOLECYSTECTOMY     CYSTOSCOPY W/ RETROGRADES Bilateral 11/20/2023   Procedure: CYSTOSCOPY, WITH RETROGRADE PYELOGRAM;  Surgeon: Carolee Sherwood JONETTA DOUGLAS, MD;  Location: [facility];  Service: Urology;  Laterality: Bilateral;  INSTILL GEMCITABINE    CYSTOSCOPY W/ URETERAL STENT PLACEMENT Bilateral 06/16/2022   Procedure: BILATERAL RETROGRADE PYELOGRAM;  Surgeon: Carolee Sherwood JONETTA DOUGLAS, MD;  Location: [facility];  Service: Urology;  Laterality: Bilateral;   CYSTOSCOPY WITH FULGERATION N/A 11/20/2023   Procedure: CYSTOSCOPY, WITH BLADDER FULGURATION;  Surgeon: Carolee Sherwood JONETTA DOUGLAS, MD;  Location: [facility];  Service: Urology;  Laterality: N/A;   ENDOMETRIAL BIOPSY     per patient will be 10-12-23   left thumb surgery      TONSILLECTOMY     early 20's   TRANSURETHRAL RESECTION OF BLADDER TUMOR  01/13/2019    OB History   No obstetric history on file.      Home Medications    Prior to Admission medications   Medication Sig Start Date End Date Taking? Authorizing  Provider  doxycycline  (VIBRAMYCIN ) 100 MG capsule Take 1 capsule (100 mg total) by mouth 2 (two) times daily for 10 days. 04/19/24 04/29/24 Yes Ival Domino, FNP  acetaminophen  (TYLENOL ) 500 MG tablet Take 500 mg by mouth every 6 (six) hours as needed (pain.).    [provider]  albuterol  (VENTOLIN  HFA) 108 (90 Base) MCG/ACT inhaler Inhale 2 puffs into the lungs every 4 (four) hours as needed for wheezing or shortness of breath. 04/14/24   Ival Domino, FNP  ALPRAZolam  (XANAX ) 0.5 MG tablet Take 0.5 mg by mouth at bedtime.    [provider]  dicyclomine  (BENTYL ) 10 MG capsule Take 1 capsule (  10 mg total) by mouth 2 (two) times daily as needed for spasms. 10/01/23   Charlanne Groom, MD  escitalopram (LEXAPRO) 10 MG tablet Take 10 mg by mouth daily as needed (mood regulation/anxiety). 10/16/23   [provider]  metoprolol  tartrate (LOPRESSOR ) 25 MG tablet Take 2 tablets (50 mg total) daily as needed by mouth. Patient taking differently: Take 25 mg by mouth daily as needed (palpitations). 03/18/17   Monetta Redell PARAS, MD  pantoprazole  (PROTONIX ) 40 MG tablet Take 1 tablet (40 mg total) by mouth daily. 10/01/23   Charlanne Groom, MD  polyethylene glycol (MIRALAX / GLYCOLAX) 17 g packet Take 17 g by mouth in the morning.    [provider]  promethazine -dextromethorphan (PROMETHAZINE -DM) 6.25-15 MG/5ML syrup Take 5 mLs by mouth 4 (four) times daily as needed for cough. Do not use and drive - May make drowsy. 04/19/24   Ival Domino, FNP  Wheat Dextrin (BENEFIBER ON THE GO) PACK Take 1 packet by mouth in the morning.    [provider]    Family History Family History  Problem Relation Age of Onset   Prostate cancer Father        died about 15 years ago   Stroke Maternal Grandfather    Diabetes Child    Colon cancer Neg Hx    Esophageal cancer Neg Hx    Liver disease Neg Hx     Social History Social History   Tobacco Use   Smoking status: Never     Passive exposure: Never   Smokeless tobacco: Never  Vaping Use   Vaping status: Never Used  Substance Use Topics   Alcohol use: No   Drug use: Never     Allergies   Pseudoephedrine   Review of Systems Review of Systems  Constitutional:  Negative for chills and fever.  HENT:  Positive for congestion, postnasal drip and rhinorrhea. Negative for ear pain and sore throat.   Eyes:  Negative for pain and visual disturbance.  Respiratory:  Positive for cough, chest tightness, shortness of breath and wheezing.   Cardiovascular:  Negative for chest pain and palpitations.  Gastrointestinal:  Negative for abdominal pain, constipation, diarrhea, nausea and vomiting.  Genitourinary:  Negative for dysuria and hematuria.  Musculoskeletal:  Negative for arthralgias and back pain.  Skin:  Negative for color change and rash.  Neurological:  Negative for seizures and syncope.  All other systems reviewed and are negative.    Physical Exam Triage Vital Signs ED Triage Vitals  Encounter Vitals Group     BP 04/19/24 0956 136/75     Girls Systolic BP Percentile --      Girls Diastolic BP Percentile --      Boys Systolic BP Percentile --      Boys Diastolic BP Percentile --      Pulse Rate 04/19/24 0956 72     Resp 04/19/24 0956 18     Temp 04/19/24 0956 98.6 F (37 C)     Temp Source 04/19/24 0956 Oral     SpO2 04/19/24 0956 94 %     Weight --      Height --      Head Circumference --      Peak Flow --      Pain Score 04/19/24 0955 7     Pain Loc --      Pain Education --      Exclude from Growth Chart --    No data found.  Updated  Vital Signs BP 136/75 (BP Location: Right Arm)   Pulse 72   Temp 98.6 F (37 C) (Oral)   Resp 18   SpO2 94%   Visual Acuity Right Eye Distance:   Left Eye Distance:   Bilateral Distance:    Right Eye Near:   Left Eye Near:    Bilateral Near:     Physical Exam Vitals and nursing note reviewed.  Constitutional:      General: She is not in  acute distress.    Appearance: She is well-developed. She is ill-appearing. She is not toxic-appearing or diaphoretic.  HENT:     Head: Normocephalic and atraumatic.     Right Ear: Hearing, tympanic membrane, ear canal and external ear normal.     Left Ear: Hearing, tympanic membrane, ear canal and external ear normal.     Nose: Mucosal edema, congestion and rhinorrhea present. Rhinorrhea is purulent.     Right Sinus: Maxillary sinus tenderness and frontal sinus tenderness present.     Left Sinus: Maxillary sinus tenderness and frontal sinus tenderness present.     Mouth/Throat:     Lips: Pink.     Mouth: Mucous membranes are moist.     Pharynx: Uvula midline. No oropharyngeal exudate or posterior oropharyngeal erythema.     Tonsils: No tonsillar exudate.  Eyes:     Conjunctiva/sclera: Conjunctivae normal.     Pupils: Pupils are equal, round, and reactive to light.  Cardiovascular:     Rate and Rhythm: Normal rate and regular rhythm.     Heart sounds: S1 normal and S2 normal. No murmur heard. Pulmonary:     Effort: Pulmonary effort is normal. No respiratory distress.     Breath sounds: Examination of the right-upper field reveals wheezing. Examination of the left-upper field reveals wheezing. Examination of the right-middle field reveals wheezing. Examination of the left-middle field reveals wheezing. Examination of the right-lower field reveals wheezing. Examination of the left-lower field reveals wheezing. Wheezing (Mild to moderate inspiratory and expiratory wheezes throughout) present. No decreased breath sounds, rhonchi or rales.     Comments: Reassessment after DuoNeb treatment: Oxygen saturation went from 91-92% on room air to 98% on room air after DuoNeb treatment.  Patient continued with wheezing after the treatment but it was significantly improved and her shortness of breath improved. Abdominal:     General: Bowel sounds are normal.     Palpations: Abdomen is soft.      Tenderness: There is no abdominal tenderness.  Musculoskeletal:        General: No swelling.     Cervical back: Neck supple.  Lymphadenopathy:     Head:     Right side of head: No submental, submandibular, tonsillar, preauricular or posterior auricular adenopathy.     Left side of head: No submental, submandibular, tonsillar, preauricular or posterior auricular adenopathy.     Cervical: Cervical adenopathy present.     Right cervical: Superficial cervical adenopathy present.     Left cervical: Superficial cervical adenopathy present.  Skin:    General: Skin is warm and dry.     Capillary Refill: Capillary refill takes less than 2 seconds.     Findings: No rash.  Neurological:     Mental Status: She is alert and oriented to person, place, and time.  Psychiatric:        Mood and Affect: Mood normal.      UC Treatments / Results  Labs (all labs ordered are listed, but only abnormal results are  displayed) Labs Reviewed  POCT RESPIRATORY SYNCYTIAL VIRUS - Normal    EKG   Radiology DG Chest 2 View Result Date: 04/19/2024 EXAM: 2 VIEW(S) XRAY OF THE CHEST 04/19/2024 10:39:40 AM COMPARISON: Comparison: 10/24/2022. CLINICAL HISTORY: cough and shortness of breath FINDINGS: LUNGS AND PLEURA: No focal pulmonary opacity. No pleural effusion. No pneumothorax. HEART AND MEDIASTINUM: No acute abnormality of the cardiac and mediastinal silhouettes. BONES AND SOFT TISSUES: Cholecystectomy clips. No acute osseous abnormality. IMPRESSION: 1. No acute cardiopulmonary process. Electronically signed by: Katheleen Faes MD 04/19/2024 10:50 AM EST RP Workstation: [id number]    Procedures Procedures (including critical care time)  Medications Ordered in UC Medications  ipratropium-albuterol  (DUONEB) 0.5-2.5 (3) MG/3ML nebulizer solution 3 mL (3 mLs Nebulization Given 04/19/24 1041)  cefTRIAXone  (ROCEPHIN ) injection 1 g (1 g Intramuscular Given 04/19/24 1112)  methylPREDNISolone  acetate (DEPO-MEDROL )  injection 80 mg (80 mg Intramuscular Given 04/19/24 1112)    Initial Impression / Assessment and Plan / UC Course  I have reviewed the triage vital signs and the nursing notes.  Pertinent labs & imaging results that were available during my care of the patient were reviewed by me and considered in my medical decision making (see chart for details).  Plan of Care (see discharge instructions for additional patient precautions and education): Sinusitis and bronchitis with cough and shortness of breath:  Rapid RSV was negative.   Chest x-ray is negative for pneumonia but patient advised that given her exam findings and symptoms, she could have early pneumonia and it might not show up on the films until its progressed further.   Ceftriaxone  at 1000 mg IM now.   Depo-Medrol  80 mg steroid injection now.   Doxycycline  100 mg twice daily for 10 days.   Get plenty of fluids and rest.   She had a huge improvement in breath sounds and oxygenation with the DuoNeb treatment.  Use the albuterol  inhaler, 2 puffs, every 4 hours if needed for cough or wheezing.   Promethazine  DM, 5 mL, every 6 hours if needed for cough. Reviewed signs and symptoms of worsening condition and reasons to return here or go to an emergency room (see discharge instructions for more information).   Needs to make an appointment with primary care and get rechecked in 2 to 3 weeks or may return here if needed.  I reviewed the plan of care with the patient and/or the patient's guardian.  The patient and/or guardian had time to ask questions and acknowledged that the questions were answered.   I spent 30 minutes on patient care, including face to face time and care planning/patient management.  Additional time was needed for collection of history, assessment, independent interpretation of chest x-ray prior to radiology review, review of labs and discussion of treatment plan and options. Final Clinical Impressions(s) / UC Diagnoses    Final diagnoses:  Acute cough  Shortness of breath  Acute non-recurrent pansinusitis  Acute viral bronchitis     Discharge Instructions      Sinusitis and bronchitis with cough and shortness of breath: Rapid RSV was negative.  Chest x-ray is negative for pneumonia but patient advised that given her exam findings and symptoms, she could have early pneumonia and it might not show up on the films until its progressed further.  Will treat with ceftriaxone  at 1000 mg IM now.  Will treat with Depo-Medrol  80 mg steroid injection now.  Doxycycline  100 mg twice daily for 10 days.  Get plenty of fluids and rest.  She  had a huge improvement in breath sounds and oxygenation with the DuoNeb treatment.  Use the albuterol  inhaler, 2 puffs, every 4 hours if needed for cough or wheezing.  Continue Promethazine  DM, 5 mL, every 6 hours if needed for cough.  Reviewed signs and symptoms of worsening condition and reasons to return here or go to an emergency room (see below for more information).    Needs to make an appointment with primary care and get rechecked in 2 to 3 weeks or may return here if needed.  Get help right away if you: Cough up blood. Feel pain in your chest. Have severe shortness of breath. Faint or keep feeling like you are going to faint. Have a severe headache. Have a fever or chills that get worse. These symptoms may represent a serious problem that is an emergency. Do not wait to see if the symptoms will go away. Get medical help right away. Call your local emergency services (911 in the [place] Do not drive yourself to the hospital.     ED Prescriptions     Medication Sig Dispense Auth. Provider   promethazine -dextromethorphan (PROMETHAZINE -DM) 6.25-15 MG/5ML syrup Take 5 mLs by mouth 4 (four) times daily as needed for cough. Do not use and drive - May make drowsy. 118 mL Ival Domino, FNP   doxycycline  (VIBRAMYCIN ) 100 MG capsule Take 1 capsule (100 mg total) by mouth 2 (two)  times daily for 10 days. 20 capsule Ival Domino, FNP      PDMP not reviewed this encounter.   Ival Domino, FNP 04/19/24 1125    Ival Domino, FNP 04/19/24 1126

## 2024-04-19 NOTE — ED Triage Notes (Addendum)
 Pt was seen here on 12/4, completed medication from visit. Has cough nasal congestion shortness of breathe coughing up green mucous since last visit. Denies ear pain,fever, sore throat. Pt states she is not any better can't breathe good.
